# Patient Record
Sex: Male | Born: 1950 | Hispanic: No | Marital: Married | Smoking: Never smoker
Health system: Southern US, Community
[De-identification: ages and names within clinical notes are randomized; demographics above are authoritative.]

## PROBLEM LIST (undated history)

## (undated) DIAGNOSIS — E785 Hyperlipidemia, unspecified: Secondary | ICD-10-CM

## (undated) DIAGNOSIS — I252 Old myocardial infarction: Secondary | ICD-10-CM

## (undated) DIAGNOSIS — I10 Essential (primary) hypertension: Secondary | ICD-10-CM

## (undated) DIAGNOSIS — Z951 Presence of aortocoronary bypass graft: Secondary | ICD-10-CM

## (undated) DIAGNOSIS — N529 Male erectile dysfunction, unspecified: Secondary | ICD-10-CM

## (undated) DIAGNOSIS — I2510 Atherosclerotic heart disease of native coronary artery without angina pectoris: Secondary | ICD-10-CM

## (undated) DIAGNOSIS — Z955 Presence of coronary angioplasty implant and graft: Secondary | ICD-10-CM

## (undated) DIAGNOSIS — R9439 Abnormal result of other cardiovascular function study: Secondary | ICD-10-CM

## (undated) DIAGNOSIS — I25810 Atherosclerosis of coronary artery bypass graft(s) without angina pectoris: Secondary | ICD-10-CM

## (undated) DIAGNOSIS — Z95828 Presence of other vascular implants and grafts: Secondary | ICD-10-CM

## (undated) HISTORY — DX: Presence of coronary angioplasty implant and graft: Z95.5

## (undated) HISTORY — DX: Atherosclerotic heart disease of native coronary artery without angina pectoris: I25.10

## (undated) HISTORY — DX: Old myocardial infarction: I25.2

## (undated) HISTORY — DX: Male erectile dysfunction, unspecified: N52.9

## (undated) HISTORY — DX: Atherosclerosis of coronary artery bypass graft(s) without angina pectoris: I25.810

## (undated) HISTORY — DX: Presence of other vascular implants and grafts: Z95.828

## (undated) HISTORY — DX: Hyperlipidemia, unspecified: E78.5

## (undated) HISTORY — PX: CORONARY ANGIOPLASTY WITH STENT PLACEMENT: SHX49

## (undated) HISTORY — DX: Presence of aortocoronary bypass graft: Z95.1

## (undated) HISTORY — DX: Abnormal result of other cardiovascular function study: R94.39

---

## 2002-08-11 ENCOUNTER — Inpatient Hospital Stay (HOSPITAL_COMMUNITY): Admission: AD | Admit: 2002-08-11 | Discharge: 2002-08-13 | Payer: Self-pay | Admitting: Cardiology

## 2003-05-03 ENCOUNTER — Encounter: Admission: RE | Admit: 2003-05-03 | Discharge: 2003-05-03 | Payer: Self-pay | Admitting: Specialist

## 2004-06-26 DIAGNOSIS — I252 Old myocardial infarction: Secondary | ICD-10-CM

## 2004-06-26 DIAGNOSIS — Z951 Presence of aortocoronary bypass graft: Secondary | ICD-10-CM | POA: Insufficient documentation

## 2004-06-26 HISTORY — DX: Old myocardial infarction: I25.2

## 2004-06-26 HISTORY — PX: CORONARY ARTERY BYPASS GRAFT: SHX141

## 2004-06-26 HISTORY — DX: Presence of aortocoronary bypass graft: Z95.1

## 2004-07-08 ENCOUNTER — Encounter: Payer: Self-pay | Admitting: Emergency Medicine

## 2004-07-08 ENCOUNTER — Inpatient Hospital Stay (HOSPITAL_COMMUNITY): Admission: AD | Admit: 2004-07-08 | Discharge: 2004-07-18 | Payer: Self-pay | Admitting: Cardiovascular Disease

## 2004-08-18 ENCOUNTER — Encounter: Admission: RE | Admit: 2004-08-18 | Discharge: 2004-08-18 | Payer: Self-pay | Admitting: Cardiothoracic Surgery

## 2004-08-21 ENCOUNTER — Encounter (HOSPITAL_COMMUNITY): Admission: RE | Admit: 2004-08-21 | Discharge: 2004-11-19 | Payer: Self-pay | Admitting: Cardiovascular Disease

## 2004-10-23 ENCOUNTER — Ambulatory Visit (HOSPITAL_COMMUNITY): Admission: RE | Admit: 2004-10-23 | Discharge: 2004-10-25 | Payer: Self-pay | Admitting: Cardiovascular Disease

## 2004-11-17 ENCOUNTER — Encounter: Admission: RE | Admit: 2004-11-17 | Discharge: 2004-11-17 | Payer: Self-pay | Admitting: Cardiology

## 2005-03-07 ENCOUNTER — Emergency Department (HOSPITAL_COMMUNITY): Admission: EM | Admit: 2005-03-07 | Discharge: 2005-03-07 | Payer: Self-pay | Admitting: Emergency Medicine

## 2005-03-19 ENCOUNTER — Encounter: Admission: RE | Admit: 2005-03-19 | Discharge: 2005-03-19 | Payer: Self-pay | Admitting: General Practice

## 2005-03-29 ENCOUNTER — Encounter: Admission: RE | Admit: 2005-03-29 | Discharge: 2005-04-18 | Payer: Self-pay | Admitting: General Practice

## 2005-12-10 ENCOUNTER — Encounter: Admission: RE | Admit: 2005-12-10 | Discharge: 2005-12-10 | Payer: Self-pay | Admitting: Cardiology

## 2005-12-21 ENCOUNTER — Ambulatory Visit (HOSPITAL_COMMUNITY): Admission: RE | Admit: 2005-12-21 | Discharge: 2005-12-21 | Payer: Self-pay | Admitting: Cardiology
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
## 2007-06-21 IMAGING — CR DG CHEST 2V
2 series · 2 of 2 positions shown · non-contrast
Comparison: 03/07/05.

CLINICAL DATA: Coronary artery disease.  CABG.  
 CHEST ? 2 VIEW:

[w chest pa]
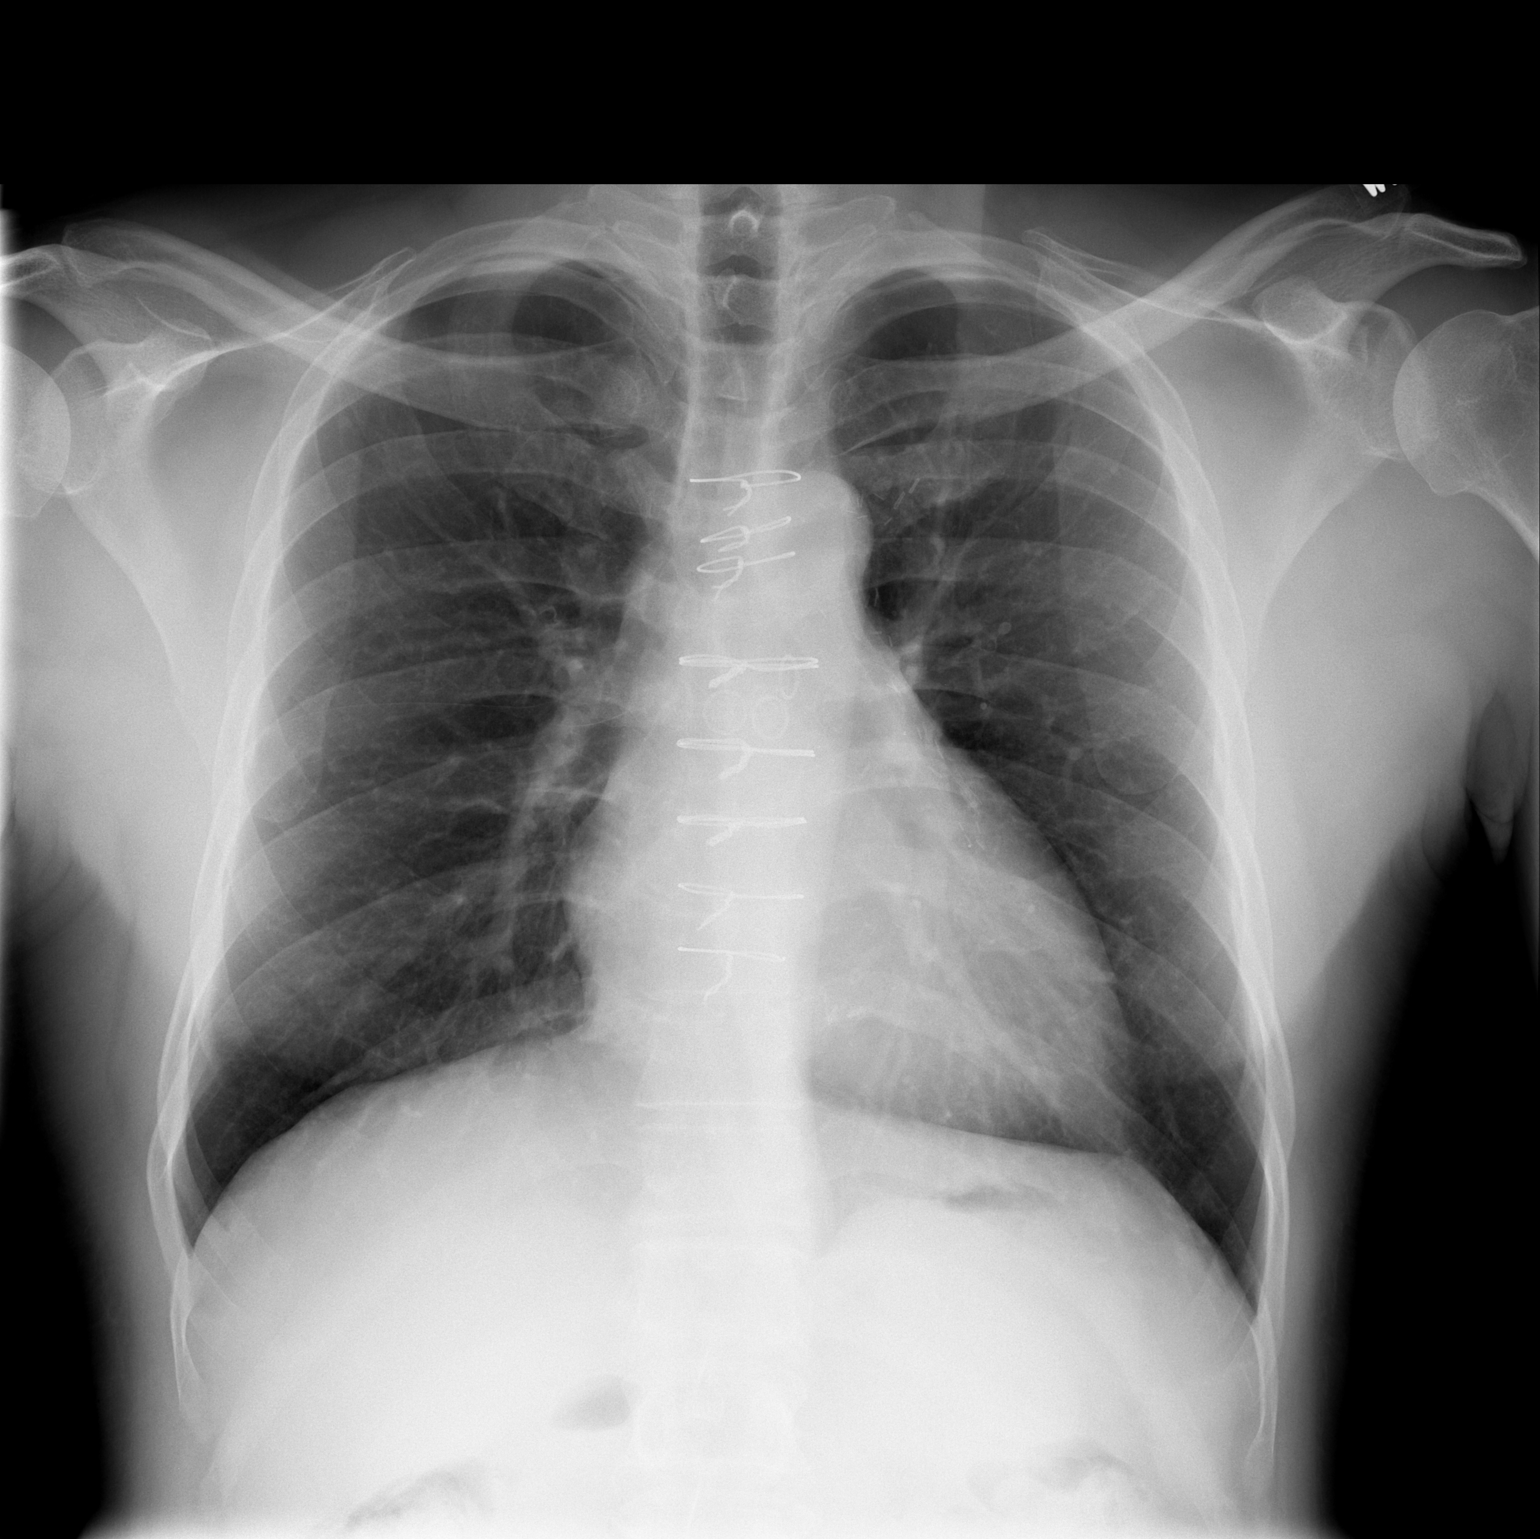

[w chest lat]
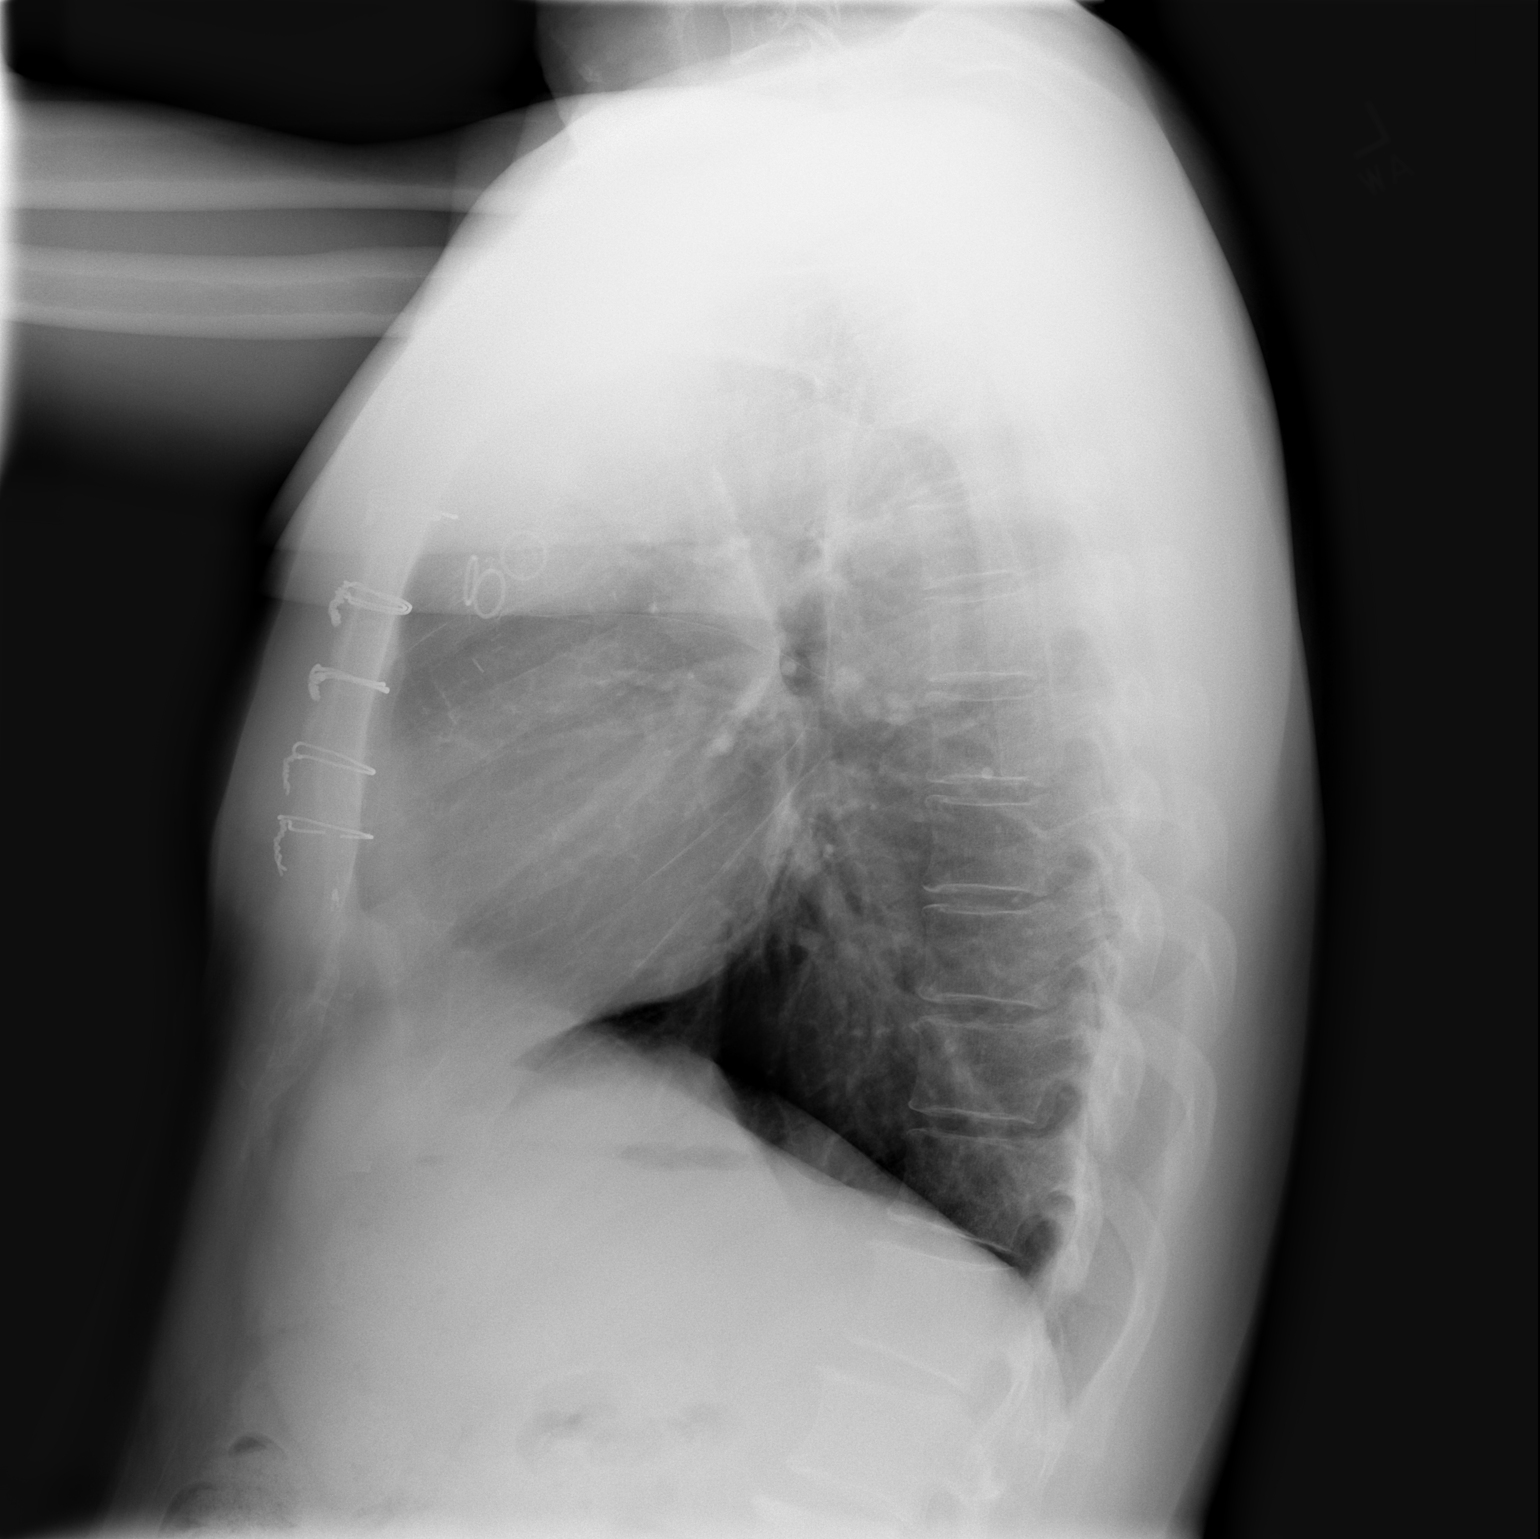

[2 of 2 positions shown; findings below may reference images not displayed]

FINDINGS: Patient is status-post median sternotomy and CABG procedure.  Heart size is normal.  There are no effusions or edema.  No airspace opacities are identified.
IMPRESSION: 1.  Stable mild cardiomegaly. 
 2.  No active cardiopulmonary disease.

## 2010-09-27 ENCOUNTER — Other Ambulatory Visit: Payer: Self-pay | Admitting: Cardiovascular Disease

## 2010-09-27 NOTE — Telephone Encounter (Signed)
This patient is not being followed by Dr. Mariah Milling.

## 2011-02-15 ENCOUNTER — Other Ambulatory Visit: Payer: Self-pay | Admitting: Cardiovascular Disease

## 2011-02-15 ENCOUNTER — Telehealth: Payer: Self-pay

## 2011-02-15 NOTE — Telephone Encounter (Signed)
Refill sent for metoprolol.  

## 2011-02-15 NOTE — Telephone Encounter (Signed)
Notified pharmacist patient has not been seen by Dr. Mariah Milling at [facility].  Told need to contact [facility] for refills on medications. [facility] pharmacist tech states will cancel the refill that was sent by Dr. Mariah Milling.

## 2011-07-31 ENCOUNTER — Encounter (HOSPITAL_COMMUNITY): Payer: Self-pay | Admitting: *Deleted

## 2011-07-31 ENCOUNTER — Emergency Department (HOSPITAL_COMMUNITY): Payer: Managed Care, Other (non HMO)

## 2011-07-31 ENCOUNTER — Emergency Department (HOSPITAL_COMMUNITY)
Admission: EM | Admit: 2011-07-31 | Discharge: 2011-07-31 | Disposition: A | Discharge status: Home / Self Care | Payer: Managed Care, Other (non HMO) | Attending: Emergency Medicine | Admitting: Emergency Medicine

## 2011-07-31 DIAGNOSIS — I10 Essential (primary) hypertension: Secondary | ICD-10-CM | POA: Insufficient documentation

## 2011-07-31 DIAGNOSIS — R0781 Pleurodynia: Secondary | ICD-10-CM

## 2011-07-31 DIAGNOSIS — I2510 Atherosclerotic heart disease of native coronary artery without angina pectoris: Secondary | ICD-10-CM | POA: Insufficient documentation

## 2011-07-31 DIAGNOSIS — R079 Chest pain, unspecified: Secondary | ICD-10-CM | POA: Insufficient documentation

## 2011-07-31 HISTORY — DX: Essential (primary) hypertension: I10

## 2011-07-31 NOTE — ED Notes (Signed)
Pt reports L sided rib pain x5 days. Denies injury. Sts he exercises, pain worse with certain movements and deep breaths.

## 2011-07-31 NOTE — ED Notes (Signed)
Patient discharge via ambulatory with a steady gait. Respirations equal and unlabored. Skin warm and dry. No acute distress noted. 

## 2011-07-31 NOTE — ED Provider Notes (Signed)
History     CSN: [id number]  Arrival date & time 07/31/11  1803   First MD Initiated Contact with Patient 07/31/11 2111      Chief Complaint  Patient presents with  . Rib Pain     (Consider location/radiation/quality/duration/timing/severity/associated sxs/prior treatment) HPI Comments: Patient reports that he has pain of his left lower rib for the past 2 weeks.  Pain has been constant.  He has been doing frequent exercises to try to help with the pain.  Pain has not changed over the course of 2 weeks.  He reports that he does daily exercises and lifts weights.  He has not taken anything for pain.  He denies any acute injury or trauma.  Denies SOB.  Denies cough, fever, or chills.  Denies nausea, vomiting, or diaphoresis.    The history is provided by the patient.    Past Medical History  Diagnosis Date  . Hypertension   . Coronary artery disease     Past Surgical History  Procedure Date  . Cardiac surgery     No family history on file.  History  Substance Use Topics  . Smoking status: Never Smoker   . Smokeless tobacco: Not on file  . Alcohol Use: No      Review of Systems  Constitutional: Negative for fever, chills and diaphoresis.  Respiratory: Negative for cough, chest tightness, shortness of breath and wheezing.   Cardiovascular: Negative for leg swelling.  Gastrointestinal: Negative for nausea and vomiting.  Musculoskeletal: Negative for back pain.  Skin: Negative for rash.  Neurological: Negative for dizziness, syncope, light-headedness and numbness.    Allergies  Review of patient's allergies indicates no known allergies.  Home Medications   Current Outpatient Rx  Name Route Sig Dispense Refill  . AMLODIPINE BESY-BENAZEPRIL HCL 10-40 MG PO CAPS Oral Take 1 capsule by mouth daily.    . ASPIRIN EC 81 MG PO TBEC Oral Take 81 mg by mouth daily.    [name] EZETIMIBE-SIMVASTATIN 10-40 MG PO TABS Oral Take 1 tablet by mouth daily.    [name] METOPROLOL SUCCINATE ER  50 MG PO TB24  TAKE ONE-HALF TABLET BY MOUTH EVERY DAY 90 tablet 3  . PANTOPRAZOLE SODIUM 40 MG PO TBEC Oral Take 40 mg by mouth daily.      BP 142/67  Pulse 64  Temp(Src) 98.5 F (36.9 C) (Oral)  Resp 16  SpO2 100%  Physical Exam  Nursing note and vitals reviewed. Constitutional: He appears well-developed and well-nourished. No distress.  HENT:  Head: Normocephalic and atraumatic.  Mouth/Throat: Oropharynx is clear and moist.  Neck: Normal range of motion. Neck supple.  Cardiovascular: Normal rate, regular rhythm and normal heart sounds.   Pulmonary/Chest: Effort normal and breath sounds normal. No accessory muscle usage. Not tachypneic. No respiratory distress. He has no decreased breath sounds. He has no wheezes. He has no rales. He exhibits tenderness.       Tenderness to palpation of the 8th left lateral rib.  Abdominal: Soft. There is no tenderness.  Musculoskeletal: Normal range of motion.  Neurological: He is alert.  Skin: Skin is warm and dry. No rash noted. He is not diaphoretic. No erythema.  Psychiatric: He has a normal mood and affect.    ED Course  Procedures (including critical care time)  Labs Reviewed - No data to display Dg Ribs Unilateral W/chest Left  07/31/2011  *RADIOLOGY REPORT*  Clinical Data: Chest and rib pain.  LEFT RIBS AND CHEST - 3+ VIEW  Comparison: 12/10/2005.  Findings: The cardiac silhouette, mediastinal and hilar contours are within normal limits and stable.  Stable surgical changes from triple bypass surgery.  The lungs are clear.  No pleural effusion. No pneumothorax.  Dedicated views of the left ribs demonstrate no definite acute rib fractures or destructive bony changes.  IMPRESSION:  1.  No acute cardiopulmonary findings. 2.  No definite acute left-sided rib fracture.  Original Report Authenticated By: P. Loralie Champagne, M.D.     No diagnosis found.    MDM  Patient presenting with pain over his left rib that has been present for 2  weeks.  Rib ttp.  Pain is constant.  No SOB, diaphoresis, nausea, or vomiting.  No known trauma or injury.  However, patient lifts weights and has been doing exercises daily over the past 2 weeks.  Therefore, feel that pain is musculoskeletal.  Xray negative.        Pascal Lux [place], PA-C 08/01/11 1559

## 2011-08-03 NOTE — ED Provider Notes (Signed)
Medical screening examination/treatment/procedure(s) were performed by non-physician practitioner and as supervising physician I was immediately available for consultation/collaboration.   Celene Kras, MD 08/03/11 [phone]

## 2012-08-18 ENCOUNTER — Telehealth: Payer: Self-pay | Admitting: Cardiology

## 2012-08-18 DIAGNOSIS — E785 Hyperlipidemia, unspecified: Secondary | ICD-10-CM

## 2012-08-18 DIAGNOSIS — I10 Essential (primary) hypertension: Secondary | ICD-10-CM

## 2012-08-18 DIAGNOSIS — I2510 Atherosclerotic heart disease of native coronary artery without angina pectoris: Secondary | ICD-10-CM

## 2012-08-18 DIAGNOSIS — Z951 Presence of aortocoronary bypass graft: Secondary | ICD-10-CM

## 2012-08-18 NOTE — Telephone Encounter (Signed)
Chart received and reviewed.  Will defer to Jasmine December, RN for Dr. Herbie Baltimore.  Chart# [id number] on Sharon's desk.

## 2012-08-18 NOTE — Telephone Encounter (Signed)
Paper chart requested.

## 2012-08-18 NOTE — Telephone Encounter (Signed)
Pt called stating that he needs a stress test? I need an order if he needs one done. Thank you

## 2012-08-19 ENCOUNTER — Telehealth (HOSPITAL_COMMUNITY): Payer: Self-pay | Admitting: Cardiology

## 2012-08-19 NOTE — Telephone Encounter (Signed)
Returned call and informed pt Kevin Yu, Dr. Elissa Hefty nurse is aware of his request and she will contact him once it has been discussed w/ Dr. Herbie Baltimore.

## 2012-08-19 NOTE — Telephone Encounter (Signed)
Pt is calling about his stress test. Does he need to have one done?

## 2012-08-19 NOTE — Telephone Encounter (Signed)
My last clinic note from Nov 2013 ndicated that I had intended for him to have a TM Cardiolite ST prior to ~6 months f/u.  Is he having symptoms, or did he simply remember this plan?  Somehow, his Cardiolite was not scheduled - it should be, prior to f/u appt.  Also, I received a notification from [facility] that he has not filled his Metoprolol 50 mg bid Rx.  Why don't we try to get the ST ordered & his f/u scheduled -- we can address the BB @ that time.  Marykay Lex, MD

## 2012-08-19 NOTE — Telephone Encounter (Signed)
See previous telephone note. 

## 2012-08-19 NOTE — Telephone Encounter (Signed)
Sure - work him in with either me or a NP/PA.  Marykay Lex, MD

## 2012-08-20 NOTE — Telephone Encounter (Signed)
Message forwarded to S. Martin, RN.  

## 2012-08-21 NOTE — Telephone Encounter (Signed)
Spoke with patient. Will have a scheduler call him to schedule exercise stress and a follow up appointment to Dr Herbie Baltimore.

## 2012-08-22 NOTE — Telephone Encounter (Signed)
thnx

## 2012-08-25 ENCOUNTER — Telehealth (HOSPITAL_COMMUNITY): Payer: Self-pay | Admitting: Cardiology

## 2012-08-25 NOTE — Telephone Encounter (Signed)
LEFT MESSAGE FOR PATIENT TO CALL AND SCHEDULE TESTING ORDERED BY [facility]

## 2012-08-26 NOTE — Telephone Encounter (Signed)
Tried to get in touch with pt to get his stress test scheduled. There was no answer on 7/1 and other number was d/c. Mailed a letter to call back to schedule this appointment.

## 2012-08-27 ENCOUNTER — Telehealth: Payer: Self-pay | Admitting: Cardiology

## 2012-08-27 NOTE — Telephone Encounter (Signed)
LEFT MESSAGE FOR PATIENT TO CALL BACK AND SCHEDULE STRESS TEST

## 2012-08-27 NOTE — Telephone Encounter (Signed)
Left message to call back  

## 2012-10-01 ENCOUNTER — Ambulatory Visit (HOSPITAL_COMMUNITY)
Admission: RE | Admit: 2012-10-01 | Discharge: 2012-10-01 | Disposition: A | Discharge status: Home / Self Care | Payer: Managed Care, Other (non HMO) | Source: Ambulatory Visit | Attending: Cardiology | Admitting: Cardiology

## 2012-10-01 ENCOUNTER — Other Ambulatory Visit (HOSPITAL_COMMUNITY): Payer: Self-pay | Admitting: Cardiology

## 2012-10-01 DIAGNOSIS — I10 Essential (primary) hypertension: Secondary | ICD-10-CM

## 2012-10-01 DIAGNOSIS — I2510 Atherosclerotic heart disease of native coronary artery without angina pectoris: Secondary | ICD-10-CM

## 2012-10-01 DIAGNOSIS — Z951 Presence of aortocoronary bypass graft: Secondary | ICD-10-CM

## 2012-10-01 DIAGNOSIS — E785 Hyperlipidemia, unspecified: Secondary | ICD-10-CM

## 2012-10-03 ENCOUNTER — Other Ambulatory Visit (HOSPITAL_COMMUNITY): Payer: Self-pay | Admitting: Cardiology

## 2012-10-03 ENCOUNTER — Ambulatory Visit (HOSPITAL_COMMUNITY)
Admission: RE | Admit: 2012-10-03 | Discharge: 2012-10-03 | Disposition: A | Discharge status: Home / Self Care | Payer: Managed Care, Other (non HMO) | Source: Ambulatory Visit | Attending: Internal Medicine | Admitting: Internal Medicine

## 2012-10-03 ENCOUNTER — Ambulatory Visit (HOSPITAL_COMMUNITY)
Admission: RE | Admit: 2012-10-03 | Discharge: 2012-10-03 | Disposition: A | Discharge status: Home / Self Care | Payer: Managed Care, Other (non HMO) | Source: Ambulatory Visit | Attending: Cardiovascular Disease | Admitting: Cardiovascular Disease

## 2012-10-03 DIAGNOSIS — I2510 Atherosclerotic heart disease of native coronary artery without angina pectoris: Secondary | ICD-10-CM

## 2012-10-03 DIAGNOSIS — Z951 Presence of aortocoronary bypass graft: Secondary | ICD-10-CM

## 2012-10-03 DIAGNOSIS — I25810 Atherosclerosis of coronary artery bypass graft(s) without angina pectoris: Secondary | ICD-10-CM | POA: Insufficient documentation

## 2012-10-03 DIAGNOSIS — I10 Essential (primary) hypertension: Secondary | ICD-10-CM | POA: Insufficient documentation

## 2012-10-03 DIAGNOSIS — R9439 Abnormal result of other cardiovascular function study: Secondary | ICD-10-CM

## 2012-10-03 DIAGNOSIS — E785 Hyperlipidemia, unspecified: Secondary | ICD-10-CM

## 2012-10-03 DIAGNOSIS — I252 Old myocardial infarction: Secondary | ICD-10-CM | POA: Insufficient documentation

## 2012-10-03 HISTORY — DX: Abnormal result of other cardiovascular function study: R94.39

## 2012-10-03 MED ORDER — TECHNETIUM TC 99M SESTAMIBI GENERIC - CARDIOLITE
11.0000 | Freq: Once | INTRAVENOUS | Status: AC | PRN
  Administered 2012-10-03: 11 via INTRAVENOUS

## 2012-10-03 MED ORDER — TECHNETIUM TC 99M SESTAMIBI GENERIC - CARDIOLITE
31.9000 | Freq: Once | INTRAVENOUS | Status: AC | PRN
  Administered 2012-10-03: 31.9 via INTRAVENOUS

## 2012-10-03 NOTE — Procedures (Addendum)
[place]  CARDIOVASCULAR IMAGING [address] [place] [address] [phone]  Cardiology Nuclear Med Study  Kevin [name] is [name] 62 y.o. male     MRN : [id number]     DOB: [birth year]  Procedure Date: 10/03/2012  Nuclear Med Background Indication for Stress Test:  Graft Patency History:  CAD;MI;CABG X4 Cardiac Risk Factors: Hypertension and Lipids  Symptoms:  DENIES SYMPTOMS   Nuclear Pre-Procedure Caffeine/Decaff Intake:  7:00pm NPO After: 5:00am   IV Site: R Forearm  IV 0.9% NS with Angio Cath:  22g  Chest Size (in):  42"  IV Started by: Emmit Pomfret, RN  Height:    Cup Size: n/[name]  BMI:  There is no height or weight on file to calculate BMI. Weight:      Tech Comments:  N/[name]    Nuclear Med Study 1 or 2 day study: 1 day  Stress Test Type:  Stress  Order Authorizing Provider:  DAVID HARDING,MD   Resting Radionuclide: Technetium 54m Sestamibi  Resting Radionuclide Dose: 11.0 mCi   Stress Radionuclide:  Technetium 46m Sestamibi  Stress Radionuclide Dose: 31.9 mCi           Stress Protocol Rest HR: 64 Stress HR: 160  Rest BP: 150/90 Stress BP: 201/92  Exercise Time (min): 8:30 METS: 10.1   Predicted Max HR: 157 bpm % Max HR: 101.91 bpm Rate Pressure Product: 46962  Dose of Adenosine (mg):  n/[name] Dose of Lexiscan: n/[name] mg  Dose of Atropine (mg): n/[name] Dose of Dobutamine: n/[name] mcg/kg/min (at max HR)  Stress Test Technologist: Esperanza Sheets, CCT Nuclear Technologist: Gonzella Lex, CNMT   Rest Procedure:  [name] The patient stopped due to achieving target heart rate and mild SOB and denied any chest pain.  There were significant ST-T wave changes.  Technetium 71m Sestamibi was injected at peak exercise and myocardial  perfusion imaging was performed after [name] brief delay.  Transient Ischemic Dilatation (Normal <1.22):  0.99 Lung/Heart Ratio (Normal <0.45):  0.26 QGS EDV:  134 ml QGS ESV:  57 ml LV Ejection Fraction: 57%  Signed by      Rest ECG: NSR with non-specific ST-T wave changes  Stress ECG: 2 - 3 mm inferolateral ST segmnt depression  QPS Raw Data Images:  Normal; no motion artifact; normal heart/lung ratio. Stress Images:  Normal homogeneous uptake in all areas of the myocardium. Rest Images:  Mild diaphragmatic attenuation with otherwise normal homogeneous uptake. Subtraction (SDS):  No evidence of statistically significant ischemia.  Impression Exercise Capacity:  Good exercise capacity. BP Response:  Hypertensive blood pressure response. Clinical Symptoms:  No chest pain; mils shortness of breath ECG Impression:   2 -3 mm ST abnormalities suggestive of ischemia. Comparison with Prior Nuclear Study: No significant change from previous study  Overall Impression:  Low risk stress nuclear study demonstrating 2 - 3 mm ST segment depression with stress and mild diaphragmatic attenuation without scintigraphic evidence for statistically significant ischemia.  LV Wall Motion:  NL LV Function, EF 57%; NL Wall Motion   Kevin Hilburn A, MD  10/03/2012 1:08 PM

## 2012-10-03 NOTE — Procedures (Signed)
[place] CARDIOVASCULAR IMAG[facility] [address] [place] [address] [phone]  Cardiology Nuclear Med Study  Kevin [name] is a 62 y.o. male     MRN : [id number]     DOB: 1950-09-16  Procedure Date: 10/03/2012  Nuclear Med Background Indication for Stress Test:  Graft Patency History:  CAD;MI;CABG X4 Cardiac Risk Factors: Hypertension and Lipids  Symptoms:  PT DENIES SYMPTOMS   Nuclear Pre-Procedure Caffeine/Decaff Intake:  7:00pm NPO After: 5:00am   IV Site: R Forearm  IV 0.9% NS with Angio Cath:  22g  Chest Size (in):  42"  IV Started by: Emmit Pomfret, RN  Height: 5\' 11"  (1.803 m)  Cup Size: n/a  BMI:  Body mass index is 29.3 kg/(m^2). Weight:  210 lb (95.255 kg)   Tech Comments:  N/A    Nuclear Med Study 1 or 2 day study: 1 day  Stress Test Type:  Stress  Order Authorizing Provider:  Bryan Lemma, MD   Resting Radionuclide: Technetium 52m Sestamibi  Resting Radionuclide Dose: 11.0 mCi   Stress Radionuclide:  Technetium 9m Sestamibi  Stress Radionuclide Dose: 31.9 mCi           Stress Protocol Rest HR: 64 Stress HR: 160  Rest BP: 150/90 Stress BP: 201/92  Exercise Time (min): 8:30 METS: 10.1   Predicted Max HR: 157 bpm % Max HR: 101.91 bpm Rate Pressure Product: 21308  Dose of Adenosine (mg):  n/a Dose of Lexiscan: n/a mg  Dose of Atropine (mg): n/a Dose of Dobutamine: n/a mcg/kg/min (at max HR)  Stress Test Technologist: Esperanza Sheets, CCT Nuclear Technologist: Gonzella Lex, CNMT   Rest Procedure:  Myocardial perfusion imaging was performed at rest 45 minutes following the intravenous administration of Technetium 49m Sestamibi. Stress Procedure:  The patient performed treadmill exercise using a Bruce  Protocol for 8:30 minutes. The patient stopped due to target heart rate achieved with mild SOB and denied any chest pain.  There were significant ST-T wave changes.  Technetium 15m Sestamibi was injected at peak exercise  and myocardial perfusion imaging was performed after a brief delay.  Transient Ischemic Dilatation (Normal <1.22):  0.77 Lung/Heart Ratio (Normal <0.45):  0.34 QGS EDV:  93 ml QGS ESV:  36 ml LV Ejection Fraction: 61%  Signed by

## 2012-10-07 ENCOUNTER — Encounter: Payer: Self-pay | Admitting: Cardiology

## 2012-10-16 ENCOUNTER — Telehealth: Payer: Self-pay | Admitting: *Deleted

## 2012-10-16 NOTE — Telephone Encounter (Signed)
Message copied by Tobin Chad on Thu Oct 16, 2012  4:42 PM ------      Message from: [facility], DAVID      Created: Sun Oct 12, 2012  2:19 PM       I will need to look at the images from his last ST to compare.       No CP with ECG changes not corresponding to Imaging ischemia - probably due to known occluded SVG.      Will just have him come in sooner than expected to discuss results.  Nuc Images over-rule ECG.            Marykay Lex, MD             ------

## 2012-10-16 NOTE — Telephone Encounter (Signed)
Left message for patient to call back  To make an appointment  To go over his myoview

## 2012-10-17 ENCOUNTER — Telehealth: Payer: Self-pay | Admitting: Cardiology

## 2012-10-17 NOTE — Telephone Encounter (Signed)
Called pt to schedule a fu appointment

## 2012-10-17 NOTE — Telephone Encounter (Signed)
Message copied by Almira Coaster on Fri Oct 17, 2012  8:53 AM ------      Message from: Tobin Chad      Created: Thu Oct 16, 2012  4:49 PM       Please call pt  about appointment to discuss recent myoview  Sometime in Sept.  He has a recall for Oct.that can be cancel if comes in Sept. ------

## 2012-10-21 ENCOUNTER — Ambulatory Visit (INDEPENDENT_AMBULATORY_CARE_PROVIDER_SITE_OTHER): Payer: Managed Care, Other (non HMO) | Admitting: Cardiology

## 2012-10-21 ENCOUNTER — Encounter: Payer: Self-pay | Admitting: Cardiology

## 2012-10-21 VITALS — BP 146/72 | HR 68 | Ht 71.0 in | Wt 206.7 lb

## 2012-10-21 DIAGNOSIS — I25810 Atherosclerosis of coronary artery bypass graft(s) without angina pectoris: Secondary | ICD-10-CM

## 2012-10-21 DIAGNOSIS — E785 Hyperlipidemia, unspecified: Secondary | ICD-10-CM

## 2012-10-21 DIAGNOSIS — I10 Essential (primary) hypertension: Secondary | ICD-10-CM

## 2012-10-21 DIAGNOSIS — I2510 Atherosclerotic heart disease of native coronary artery without angina pectoris: Secondary | ICD-10-CM

## 2012-10-21 DIAGNOSIS — Z951 Presence of aortocoronary bypass graft: Secondary | ICD-10-CM

## 2012-10-21 MED ORDER — CHLORTHALIDONE 25 MG PO TABS: 25.0000 mg | ORAL_TABLET | Freq: Every day | ORAL | Status: DC

## 2012-10-21 MED ORDER — ATORVASTATIN CALCIUM 20 MG PO TABS: 20.0000 mg | ORAL_TABLET | Freq: Every day | ORAL | Status: DC
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
## 2012-10-21 NOTE — Patient Instructions (Addendum)
Your stress test Images looked fine, but the ECG was abnormal.  Probably this is due to the known blocked artery.  The fact that you did not have symptoms on the stress test is reassuring that the images are correct.  Your blood pressure is a bit elevated today -- I would like to start you on a new medicine called Chlorthalidone 25 mg daily.  You are due for having your cholesterol levels checked -- we will probably need to restart a medicine similar to Vytorin that does not interact with your other medicines. - atorvastatin (Lipitor) 20 mg.  Please send [place] a copy of your blood work checked at work  I think you are pretty healthy, but I would like you to return for a routine check in ~6 months with one of my PAs to reassess your blood pressure &cholesterol.  Then I will see you 6 months from then(12 month from this month) . Very sorry for loss of your to brothers.   Marykay Lex, MD

## 2012-11-02 ENCOUNTER — Encounter: Payer: Self-pay | Admitting: Cardiology

## 2012-11-02 DIAGNOSIS — E785 Hyperlipidemia, unspecified: Secondary | ICD-10-CM | POA: Insufficient documentation

## 2012-11-02 DIAGNOSIS — I25810 Atherosclerosis of coronary artery bypass graft(s) without angina pectoris: Secondary | ICD-10-CM | POA: Insufficient documentation

## 2012-11-02 DIAGNOSIS — I10 Essential (primary) hypertension: Secondary | ICD-10-CM | POA: Insufficient documentation

## 2012-11-03 NOTE — Assessment & Plan Note (Signed)
I have asked that he gets his blood work checked at work. They do that annually in the fall, which should be at the end of the month. I have just asked that he send [place] the results.  Plan: Check labs start Lipitor 20 mg daily. My suspicion was that we were trying to stop Vytorin because it simvastatin in conjunction with amlodipine. And then he never got the prescription for the medication.

## 2012-11-03 NOTE — Assessment & Plan Note (Addendum)
Blood pressure is not at goal today. His heart rate at 68, don't have much room to increase the metoprolol. I had not realized that he was on HCTZ so I given a prescription for chlorthalidone. I will probably just have him stop the HCTZ and use chlorthalidone instead as a more potent medication. If chlorthalidone is ineffective, and explained would be to switch him from metoprolol to carvedilol for better blood pressure versus rate control.

## 2012-11-03 NOTE — Assessment & Plan Note (Signed)
He is actually doing relatively well with no active symptoms. 3 of 4 graft patency on the known occluded graft to the ramus. I think the ECG changes are probably consistent with prior infarct in that area. This is a began to show up on the stress test as an infarct. He did not have any symptoms reaching 10 metabolic equivalents and a half minutes which is doing quite well. I think his lack of symptoms and elected scintigraphic evidence of ischemia would suggest that the stress test is low risk with no signs of ischemia and therefore no further action needs to be taken.  He is currently on a beta blocker, ACE inhibitor as part of Lotrel and the calcium channel blocker portion of the Lotrel. He takes aspirin but not Plavix. He's only taken Lovaza but not a statin.  We will start Lipitor today.

## 2013-01-21 ENCOUNTER — Ambulatory Visit: Payer: Managed Care, Other (non HMO)

## 2013-01-21 ENCOUNTER — Ambulatory Visit: Payer: Managed Care, Other (non HMO) | Admitting: Family Medicine

## 2013-01-21 VITALS — BP 148/68 | HR 78 | Temp 99.3°F | Resp 18 | Ht 70.0 in | Wt 210.0 lb

## 2013-01-21 DIAGNOSIS — M546 Pain in thoracic spine: Secondary | ICD-10-CM

## 2013-01-21 DIAGNOSIS — M542 Cervicalgia: Secondary | ICD-10-CM

## 2013-01-21 MED ORDER — DICLOFENAC SODIUM 75 MG PO TBEC: 75.0000 mg | DELAYED_RELEASE_TABLET | Freq: Two times a day (BID) | ORAL | Status: DC

## 2013-01-21 MED ORDER — CYCLOBENZAPRINE HCL 5 MG PO TABS: 5.0000 mg | ORAL_TABLET | Freq: Every evening | ORAL | Status: DC | PRN
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
## 2013-01-23 ENCOUNTER — Other Ambulatory Visit: Payer: Self-pay | Admitting: Cardiology

## 2013-01-26 NOTE — Telephone Encounter (Signed)
Rx was sent to pharmacy electronically. 

## 2013-02-06 ENCOUNTER — Other Ambulatory Visit: Payer: Self-pay | Admitting: Cardiology

## 2013-02-06 NOTE — Telephone Encounter (Signed)
Rx was sent to pharmacy electronically. 

## 2013-02-22 ENCOUNTER — Other Ambulatory Visit: Payer: Self-pay | Admitting: Cardiology

## 2013-02-23 NOTE — Telephone Encounter (Signed)
Rx was sent to pharmacy electronically. 

## 2013-04-27 ENCOUNTER — Ambulatory Visit (INDEPENDENT_AMBULATORY_CARE_PROVIDER_SITE_OTHER): Payer: BC Managed Care – PPO | Admitting: Physician Assistant

## 2013-04-27 ENCOUNTER — Encounter: Payer: Self-pay | Admitting: Physician Assistant

## 2013-04-27 ENCOUNTER — Ambulatory Visit (HOSPITAL_COMMUNITY)
Admission: RE | Admit: 2013-04-27 | Discharge: 2013-04-27 | Disposition: A | Discharge status: Home / Self Care | Payer: BC Managed Care – PPO | Source: Ambulatory Visit | Attending: Internal Medicine | Admitting: Internal Medicine

## 2013-04-27 VITALS — BP 142/86 | HR 67 | Ht 71.0 in | Wt 206.0 lb

## 2013-04-27 DIAGNOSIS — R600 Localized edema: Secondary | ICD-10-CM

## 2013-04-27 DIAGNOSIS — R609 Edema, unspecified: Secondary | ICD-10-CM

## 2013-04-27 DIAGNOSIS — M79609 Pain in unspecified limb: Secondary | ICD-10-CM

## 2013-04-27 DIAGNOSIS — I2510 Atherosclerotic heart disease of native coronary artery without angina pectoris: Secondary | ICD-10-CM

## 2013-04-27 DIAGNOSIS — I10 Essential (primary) hypertension: Secondary | ICD-10-CM

## 2013-04-27 DIAGNOSIS — R079 Chest pain, unspecified: Secondary | ICD-10-CM | POA: Insufficient documentation

## 2013-04-27 NOTE — Assessment & Plan Note (Addendum)
Patient's chest pain appears noncardiac. It is exacerbated with palpation just left of the sternum.  Recommended some ibuprofen for pain continues

## 2013-04-27 NOTE — Assessment & Plan Note (Signed)
Blood pressure is mildly elevated this morning the patient does appear to be in some pain to his right lower extremity which may be contributing to this.

## 2013-04-27 NOTE — Progress Notes (Signed)
Date:  04/27/2013   ID:  Kevin Yu, DOB 11/26/1950, MRN [id number]  PCP:  Erlinda HongKPEGLO,MAURICE, MD  Primary Cardiologist: Herbie BaltimoreHarding    History of Present Illness: Kevin Yu is a 63 y.o. male with a PMH below.  He has a long-standing coronary artery history as delineated below, so Dr. Herbie BaltimoreHarding ordered a routine, surveillance Myoview stress test. This was read as being electrically positive for ischemia, but not positive for many scintigraphic standpoint.  He exercised for 8-1/2 minutes reaching 10.1 METS; he did not have any symptoms of chest pain, only mild shortness of breath.  There were 2-3 mm ST segment abnormalities that would suggest inferolateral ischemia. Scintigraphic imaging revealed mild diaphragmatic attenuation (which could also be consistent with a prior inferior lateral infarction) but no evidence of ischemia.  The patient presents today with right lower extremity pain and swelling.  The patient says the pain tends to ease off a little bit with ambulation.  He reports a large area of swelling or behind his right knee. This has been present for approximately one week. This reports some chest pain under his left breast area which occurred on one occasion it is exacerbated with palpation.  The patient currently denies nausea, vomiting, fever, shortness of breath, orthopnea, dizziness, PND, cough, congestion, abdominal pain, hematochezia, melena, claudication.  Wt Readings from Last 3 Encounters:  04/27/13 206 lb (93.441 kg)  01/21/13 210 lb (95.255 kg)  10/21/12 206 lb 11.2 oz (93.759 kg)     Past Medical History  Diagnosis Date  . H/O non-ST elevation myocardial infarction (NSTEMI) 06/2004    CATH - Multivessel CAD -->CABG X 4  . 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  . Presence of bare metal stent in left circumflex coronary artery     PCI RI: 2.330mm x 12 mm Mini Vision BMS  . Presence of bypass graft stent     PCI SVG-OM: 3.0 x 18 Cypher DES  . CAD  in native artery     Cath October 2007: RI-100% occluded, LAD-severe proximal 88 5%, mid 80% beyond SP1/D1 competitive flow.  Circumflex-OM1- 100 occluded.  RCA was occluded SVG-RI 100% occluded SVG-distal RCA, SVG-OM1 widely patent.  LIMA LAD patent.  [name]. CAD (coronary artery disease) of bypass graft     S/P  PCI of both SVG-OM and SVG-RI  . Hypertension   . Dyslipidemia, goal LDL below 70      formerly on Vytorin  . Erectile dysfunction     He uses Viagra  . Abnormal Nuclear Stress Test -- Myoview 10/03/2012    Exercise 8:30 min; 10.1 METS, no chest pain; 1-2 mm ST segment depression suggesting ischemia; no scintigraphic evidence of ischemia or infarction. --> Negative/low risk stress test    Current Outpatient Prescriptions  Medication Sig Dispense Refill  . amLODipine-benazepril (LOTREL) 10-40 MG per capsule TAKE ONE CAPSULE BY MOUTH EVERY DAY  30 capsule  8  . aspirin EC 81 MG tablet Take 81 mg by mouth daily.      [name]. atorvastatin (LIPITOR) 20 MG tablet Take 1 tablet (20 mg total) by mouth daily.  90 tablet  3  . chlorthalidone (HYGROTON) 25 MG tablet Take 1 tablet (25 mg total) by mouth daily.  90 tablet  3  . hydrochlorothiazide (HYDRODIURIL) 25 MG tablet TAKE ONE TABLET BY MOUTH EVERY DAY  30 tablet  5  . metoprolol succinate (TOPROL-XL) 50 MG 24 hr tablet Take 1/2 tablet my  mouth daily.  30 tablet  4  . omega-3 acid ethyl esters (LOVAZA) 1 G capsule Take 1 capsule by mouth daily.      [name] VIAGRA 100 MG tablet Take 1 tablet by mouth as needed.       No current facility-administered medications for this visit.    Allergies:   No Known Allergies  Social History:  The patient  reports that he has never smoked. He does not have any smokeless tobacco history on file. He reports that he does not drink alcohol or use illicit drugs.   Family history:  History reviewed. No pertinent family history.  ROS:  Please see the history of present illness.  All other systems reviewed and negative.     PHYSICAL EXAM: VS:  BP 142/86  Pulse 67  Ht 5\' 11"  (1.803 m)  Wt 206 lb (93.441 kg)  BMI 28.74 kg/m2 Well nourished, well developed, in no acute distress HEENT: Pupils are equal round react to light accommodation extraocular movements are intact.  Neck: no JVDNo cervical lymphadenopathy. Cardiac: Regular rate and rhythm without murmurs rubs or gallops. Lungs:  clear to auscultation bilaterally, no wheezing, rhonchi or rales Abd: soft, nontender, positive bowel sounds all quadrants, no hepatosplenomegaly Ext: 1+ right lower extremity edema. Patient has a soft area of swelling about the size of an egg behind his right knee.   2+ radial and dorsalis pedis pulses. Skin: warm and dry Neuro:  Grossly normal  EKG:  Normal sinus rhythm 76 beats per minute left atrial enlargement   ASSESSMENT AND PLAN:  Problem List Items Addressed This Visit   None

## 2013-04-27 NOTE — Patient Instructions (Signed)
1.  We will schedule you for lower extremity ultra sound of your right leg

## 2013-04-27 NOTE — Progress Notes (Signed)
Right Lower Ext. Venous Completed. Negative for DVT. Cissy Galbreath, BS, RDMS, RVT 

## 2013-04-30 NOTE — Progress Notes (Signed)
VM full

## 2013-05-18 ENCOUNTER — Encounter: Payer: Self-pay | Admitting: *Deleted

## 2013-05-21 ENCOUNTER — Ambulatory Visit: Payer: Managed Care, Other (non HMO) | Admitting: Cardiology

## 2013-07-27 ENCOUNTER — Other Ambulatory Visit: Payer: Self-pay | Admitting: Cardiology

## 2013-07-27 NOTE — Telephone Encounter (Signed)
Rx refill sent to patient pharmacy   

## 2013-11-18 ENCOUNTER — Other Ambulatory Visit: Payer: Self-pay | Admitting: Cardiology

## 2013-11-18 NOTE — Telephone Encounter (Signed)
Rx was sent to pharmacy electronically. 

## 2013-12-15 ENCOUNTER — Other Ambulatory Visit: Payer: Self-pay | Admitting: Cardiology

## 2013-12-15 NOTE — Telephone Encounter (Signed)
Rx was sent to pharmacy electronically. 

## 2014-01-19 ENCOUNTER — Other Ambulatory Visit: Payer: Self-pay | Admitting: Cardiology

## 2014-01-19 NOTE — Telephone Encounter (Signed)
Rx was sent to pharmacy electronically. 

## 2014-02-15 ENCOUNTER — Other Ambulatory Visit: Payer: Self-pay | Admitting: Cardiology

## 2014-02-15 NOTE — Telephone Encounter (Signed)
Rx refill sent to patient pharmacy   

## 2014-02-16 ENCOUNTER — Other Ambulatory Visit: Payer: Self-pay | Admitting: Cardiology

## 2014-02-16 NOTE — Telephone Encounter (Signed)
Rx refill sent to patient pharmacy   

## 2014-02-22 ENCOUNTER — Ambulatory Visit (INDEPENDENT_AMBULATORY_CARE_PROVIDER_SITE_OTHER): Payer: BC Managed Care – PPO | Admitting: Family Medicine

## 2014-02-22 VITALS — BP 170/66 | HR 67 | Temp 97.9°F | Resp 18 | Wt 195.0 lb

## 2014-02-22 DIAGNOSIS — A084 Viral intestinal infection, unspecified: Secondary | ICD-10-CM

## 2014-02-22 DIAGNOSIS — R197 Diarrhea, unspecified: Secondary | ICD-10-CM

## 2014-02-22 DIAGNOSIS — R1084 Generalized abdominal pain: Secondary | ICD-10-CM

## 2014-02-22 LAB — POCT CBC
Granulocyte percent: 64.9 %G (ref 37–80)
HEMATOCRIT: 41.8 % — AB (ref 43.5–53.7)
Hemoglobin: 13.5 g/dL — AB (ref 14.1–18.1)
Lymph, poc: 1.8 (ref 0.6–3.4)
MCH, POC: 26.9 pg — AB (ref 27–31.2)
MCHC: 32.3 g/dL (ref 31.8–35.4)
MCV: 83.3 fL (ref 80–97)
MID (CBC): 0.3 (ref 0–0.9)
MPV: 8.6 fL (ref 0–99.8)
POC Granulocyte: 4 (ref 2–6.9)
POC LYMPH %: 30 % (ref 10–50)
POC MID %: 5.1 %M (ref 0–12)
Platelet Count, POC: 128 10*3/uL — AB (ref 142–424)
RBC: 5.02 M/uL (ref 4.69–6.13)
RDW, POC: 15.1 %
WBC: 6.1 10*3/uL (ref 4.6–10.2)

## 2014-02-22 MED ORDER — DICYCLOMINE HCL 10 MG PO CAPS: 10.0000 mg | ORAL_CAPSULE | Freq: Three times a day (TID) | ORAL | Status: DC

## 2014-02-22 NOTE — Progress Notes (Signed)
Subjective: Since Saturday the patient has been having diarrhea. He has run frequently to the bathroom. Whenever he eats he has to go. The pharmacist gave him some Imodium today, and it does not seem to have helped her much. He complains of a lot of audible borborygmi. Has some bloating discomfort. Does not have a history of a lot of diarrhea. He said it started with a medially 8 on Saturday. He did go to work today.  Objective: No acute distress. No CVA tenderness. Chest clear. Heart regular without murmurs. Abdomen has bowel sounds present. Soft without masses. Mild nonspecific generalized tenderness.  Results for orders placed or performed in visit on 02/22/14  POCT CBC  Result Value Ref Range   WBC 6.1 4.6 - 10.2 K/uL   Lymph, poc 1.8 0.6 - 3.4   POC LYMPH PERCENT 30.0 10 - 50 %L   MID (cbc) 0.3 0 - 0.9   POC MID % 5.1 0 - 12 %M   POC Granulocyte 4.0 2 - 6.9   Granulocyte percent 64.9 37 - 80 %G   RBC 5.02 4.69 - 6.13 M/uL   Hemoglobin 13.5 (A) 14.1 - 18.1 g/dL   HCT, POC 19.141.8 (A) 47.843.5 - 53.7 %   MCV 83.3 80 - 97 fL   MCH, POC 26.9 (A) 27 - 31.2 pg   MCHC 32.3 31.8 - 35.4 g/dL   RDW, POC 29.515.1 %   Platelet Count, POC 128 (A) 142 - 424 K/uL   MPV 8.6 0 - 99.8 fL   Assessment: Probable viral gastroenteritis Diarrhea Mild abdominal pains  Plan: Try some Bentyl Return if worse

## 2014-02-22 NOTE — Patient Instructions (Signed)
Take dicyclomine one pill 4 times daily only if needed for the diarrhea.  Sometimes this medication can cause older men to have difficulty urinating. If that should happen discontinue the medication.  Drink plenty of fluids. Avoid rich and spicy foods. Avoid dairy products and alcohol.  If not much better by Wednesday please return. If worse at any time please return.

## 2014-03-03 ENCOUNTER — Telehealth: Payer: Self-pay | Admitting: Cardiology

## 2014-03-03 MED ORDER — AMLODIPINE BESY-BENAZEPRIL HCL 10-40 MG PO CAPS: 1.0000 | ORAL_CAPSULE | Freq: Every day | ORAL | Status: DC

## 2014-03-03 NOTE — Telephone Encounter (Signed)
Patient called. He states he does not have enough of amlodipine- benazepril to last until appointment in JAN 2016. RN informed patient will refill medication for 30 days. Patient verbalized understanding. [facility]

## 2014-03-11 ENCOUNTER — Ambulatory Visit (INDEPENDENT_AMBULATORY_CARE_PROVIDER_SITE_OTHER): Payer: BLUE CROSS/BLUE SHIELD | Admitting: Cardiology

## 2014-03-11 VITALS — BP 150/78 | Ht 69.0 in | Wt 200.6 lb

## 2014-03-11 DIAGNOSIS — I10 Essential (primary) hypertension: Secondary | ICD-10-CM

## 2014-03-11 DIAGNOSIS — I2510 Atherosclerotic heart disease of native coronary artery without angina pectoris: Secondary | ICD-10-CM

## 2014-03-11 DIAGNOSIS — I25709 Atherosclerosis of coronary artery bypass graft(s), unspecified, with unspecified angina pectoris: Secondary | ICD-10-CM

## 2014-03-11 DIAGNOSIS — E785 Hyperlipidemia, unspecified: Secondary | ICD-10-CM

## 2014-03-11 DIAGNOSIS — Z951 Presence of aortocoronary bypass graft: Secondary | ICD-10-CM

## 2014-03-11 DIAGNOSIS — Z79899 Other long term (current) drug therapy: Secondary | ICD-10-CM

## 2014-03-11 MED ORDER — ATORVASTATIN CALCIUM 20 MG PO TABS: 20.0000 mg | ORAL_TABLET | Freq: Every day | ORAL | Status: DC

## 2014-03-11 MED ORDER — CARVEDILOL 6.25 MG PO TABS: 6.2500 mg | ORAL_TABLET | Freq: Two times a day (BID) | ORAL | Status: DC

## 2014-03-11 NOTE — Patient Instructions (Signed)
STOP Metoprolol   START Carvedilol 6.25mg  TWICE daily START Lipitor 20mg    Your physician recommends that you return for lab work ZO:XWRUEin:MARCH along with a Nurse Visit to check your Blood Pressure  Your physician recommends that you schedule a follow-up appointment in: 1 year with Dr.Harding.

## 2014-03-12 ENCOUNTER — Telehealth: Payer: Self-pay | Admitting: Cardiology

## 2014-03-12 NOTE — Telephone Encounter (Signed)
Pt wants to know if his labs from Dr. Frederik PearHopper's office were received. Please call  Thanks

## 2014-03-12 NOTE — Telephone Encounter (Signed)
LMTCB

## 2014-03-13 ENCOUNTER — Encounter: Payer: Self-pay | Admitting: Cardiology

## 2014-03-13 NOTE — Assessment & Plan Note (Signed)
Not on statin as intended. Plan: Lipitor/cortisone 20 mg, continue Lovaza.  Check lipid panel and chemistry panel for new baseline.

## 2014-03-13 NOTE — Assessment & Plan Note (Signed)
Both vein grafts to have stents. On aspirin, but not Plavix.  Monitor for change in symptoms. Last Monday did not show ischemic changes. His lack of symptoms would tend to agree with this finding. See above for medications

## 2014-03-13 NOTE — Progress Notes (Signed)
Patient ID: Kevin Yu, male   DOB: 1950-08-19, 64 y.o.   MRN: [id number] PCP: Cathlean Cower, MD  Clinic Note: Chief Complaint  Patient presents with  . Follow-up    denies chest pain, dyspnea and swelling  . Coronary Artery Disease    CABG and PCI   HPI: Kevin Yu is a 64 y.o. male with a PMH below who presents today for delayed 1 year follow up.  I last way him in September 2014 to discuss there results of his Myoview stress test that had EKG changes suggesting ischemia, but no real imaging data to suggest that.There were 2-3 mm ST segment abnormalities that would suggest inferolateral ischemia. Scintigraphic imaging revealed mild diaphragmatic attenuation (which could also be consistent with a prior inferior lateral infarction) but no evidence of ischemia.  During my last visit he was supposed to start Lipitor 20 mg, which never got started. He is on the Lovaza, but not a statin. He also never started the HCTZ that was prescribed.  He did followup with Tenny Craw in March of 2015, but missed his followup appointment with me in the summer because of traveling to visit his brother in [place]. He saw Mr. Samara Snide with complaints of right lower extremity pain and swelling. He also was describing what seemed to be musculoskeletal chest pains that have since resolved.  A lower extremity venous Doppler did not show evidence of thrombus.  Interval History: Since his last visit he as done well.  He indicates that he is exercising routinely, and denies any chest pain or shortness of breath with rest or exertion. No PND, orthopnea or edema to suggest heart failure. With all the walking trip to [place] in [place], he denied any symptoms of any concern for angina or heart failure.  The remainder of Cardiovascular ROS: no chest pain or dyspnea on exertion negative for - edema, irregular heartbeat, loss of consciousness, murmur, orthopnea, palpitations, paroxysmal nocturnal dyspnea, rapid heart rate  or shortness of breath, syncope/near-syncope,TIA/amaurosis fugax   Past Medical History  Diagnosis Date  . H/O non-ST elevation myocardial infarction (NSTEMI) 06/2004    CATH - Multivessel CAD -->CABG X 4  . 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  . Presence of bare metal stent in left circumflex coronary artery     PCI RI: 2.21m x 12 mm Mini Vision BMS  . Presence of bypass graft stent     PCI SVG-OM: 3.0 x 18 Cypher DES  . CAD in native artery     Cath October 2007: RI-100% occluded, LAD-severe proximal 88 5%, mid 80% beyond SP1/D1 competitive flow.  Circumflex-OM1- 100 occluded.  RCA was occluded SVG-RI 100% occluded SVG-distal RCA, SVG-OM1 widely patent.  LIMA LAD patent.  .Marland KitchenCAD (coronary artery disease) of bypass graft     S/P  PCI of both SVG-OM and SVG-RI  . Hypertension   . Dyslipidemia, goal LDL below 70      formerly on Vytorin  . Erectile dysfunction     He uses Viagra  . Abnormal Nuclear Stress Test -- Myoview 10/03/2012    Exercise 8:30 min; 10.1 METS, no chest pain; 1-2 mm ST segment depression suggesting ischemia; no scintigraphic evidence of ischemia or infarction. --> Negative/low risk stress test    Prior Cardiac Evaluation and Past Surgical History: Past Surgical History  Procedure Laterality Date  . Coronary artery bypass graft  May 2006     4 vessel:  LIMA-LAD, SVG-Cx-OM,  SVG-RI , SVG-RCA  . Coronary angioplasty with stent placement      PCI to the ostial SVG-OM - 3.0 mm x 18 mm Cypher DES  . Coronary angioplasty with stent placement      Anastomotic PCI SVG-RI. -- 2.0 x 12 mm Mini Vision stent.   No Known Allergies  Current outpatient prescriptions:  .  amLODipine-benazepril (LOTREL) 10-40 MG per capsule, Take 1 capsule by mouth daily., Disp: 30 capsule, Rfl: 0 .  aspirin EC 81 MG tablet, Take 81 mg by mouth daily., Disp: , Rfl:  .  hydrochlorothiazide (HYDRODIURIL) 25 MG tablet, TAKE ONE TABLET BY MOUTH ONCE DAILY, Disp: 30 tablet,  Rfl: 2 .  omega-3 acid ethyl esters (LOVAZA) 1 G capsule, Take 1 capsule by mouth daily., Disp: , Rfl:   Metoprolol succinate 50 mg (~ he may be taking 1/2 bid) .  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   [place] now. He currently works for [employer].   The mood due to political unrest.   He is a married, father of 24, grandfather of 3.   He exercises routinely at least 2 times a week on the treadmill for maybe an hour at a time.      Last summer, he took a prolonged 3 week vacation to visit family members who live in [place].  He travelled to [place] as well.  As a result, he missed his intended appointment with me over the summer.   ROS: A comprehensive Review of Systems - was performed Review of Systems  HENT: Negative for nosebleeds.   Respiratory: Negative for cough.   Cardiovascular: Negative for claudication.  Gastrointestinal: Negative for blood in stool and melena.  Genitourinary: Negative for hematuria.  Endo/Heme/Allergies: Does not bruise/bleed easily.  Psychiatric/Behavioral:       Did not comment on how he is handling his brothers' death.  I think he has been so excited about his recent trip, that he did not have time to worry about it.   All other systems reviewed and are negative.  PHYSICAL EXAM BP 150/78 mmHg  Ht '5\' 9"'  (1.753 m)  Wt 200 lb 9.6 oz (90.992 kg)  BMI 29.61 kg/m2 General appearance: alert, cooperative, appears stated age, no distress and Healthy-appearing; well-nourished and well-groomed. Neck: no adenopathy, no carotid bruit, no JVD, supple, symmetrical, trachea midline and thyroid not enlarged, symmetric, no tenderness/mass/nodules Lungs: clear to auscultation bilaterally, normal percussion bilaterally and Nonlabored, good air movement. Heart: regular  rate and rhythm, S1, S2 normal, no S3 or S4, systolic murmur: systolic ejection 1/6, crescendo and decrescendo at 2nd right intercostal space, no click and no rub Abdomen: soft, non-tender; bowel sounds normal; no masses,  no organomegaly Extremities: extremities normal, atraumatic, no cyanosis or edema, no edema, redness or tenderness in the calves or thighs and no ulcers, gangrene or trophic changes Pulses: 2+ and symmetric Neurologic: Alert and oriented X 3, normal strength and tone. Normal symmetric reflexes. Normal coordination and gait  YME:BRAXENMMH today: Yes NSR, 65, artifact, but otherwise normal  Recent Labs: None in over one year  ASSESSMENT / PLAN: CAD in native artery --> status post CABG x4, SVG-RI occluded following PCI, other grafts open with SVG-OM stent Doing well no active anginal symptoms.  No heart failure symptoms. Evidence of infarct on his most recent Myoview, but no real evidence of ischemia. I think the EKG changes are probably related to the infarct.  He is on a beta blocker and ACE inhibitors Will Scott channel blocker. He is no longer on statin as this did not get started. He is to on Lovaza, but never changed statin to Lipitor as previously prescribed. Plan: Rx Lipitor 20 mg, convert BB to Carvedilol.   CAD (coronary artery disease) of bypass graft Both vein grafts to have stents. On aspirin, but not Plavix.  Monitor for change in symptoms. Last Monday did not show ischemic changes. His lack of symptoms would tend to agree with this finding. See above for medications   Dyslipidemia, goal LDL below 70 Not on statin as intended. Plan: Lipitor/cortisone 20 mg, continue Lovaza.  Check lipid panel and chemistry panel for new baseline.   Essential (primary) hypertension Higher blood pressure today in our light. On my recheck was 150/78 mmHg. When he comes in to get his labs checked fasting nursing visit for blood pressure check. Convert beta blocker from  metoprolol/Toprol - 2 carvedilol. Start with 6.5 mg twice a day. Pending blood pressure checked when he comes for his labs, may consider titrating carvedilol to 12.5 mg twice a day.    I will also refill his Viagra.  Orders Placed This Encounter  Procedures  . Comp Met (CMET)  . Lipid Profile  . EKG 12-Lead   New Rx: .  atorvastatin (LIPITOR) 20 MG tablet, Take 1 tablet (20 mg total) by mouth daily., Disp: 90 tablet, Rfl: 3 .  carvedilol (COREG) 6.25 MG tablet, Take 1 tablet (6.25 mg total) by mouth 2 (two) times daily., Disp: 180 tablet, Rfl: 3 D/c metoprolol   Followup: one year.Provided his blood pressure and lipids are well controlled    HARDING, Leonie Green, M.D., M.S. Interventional Cardiologist   Pager # [phone]

## 2014-03-13 NOTE — Assessment & Plan Note (Signed)
Doing well no active anginal symptoms. No heart failure symptoms. Evidence of infarct on his most recent Myoview, but no real evidence of ischemia. I think the EKG changes are probably related to the infarct.  He is on a beta blocker and ACE inhibitors Will Scott channel blocker. He is no longer on statin as this did not get started. He is to on Lovaza, but never changed statin to Lipitor as previously prescribed. Plan: Rx Lipitor 20 mg, convert BB to Carvedilol.

## 2014-03-13 NOTE — Assessment & Plan Note (Signed)
Higher blood pressure today in our light. On my recheck was 150/78 mmHg. When he comes in to get his labs checked fasting nursing visit for blood pressure check. Convert beta blocker from metoprolol/Toprol - 2 carvedilol. Start with 6.5 mg twice a day. Pending blood pressure checked when he comes for his labs, may consider titrating carvedilol to 12.5 mg twice a day.

## 2014-03-16 ENCOUNTER — Other Ambulatory Visit: Payer: Self-pay

## 2014-03-16 MED ORDER — AMLODIPINE BESY-BENAZEPRIL HCL 10-40 MG PO CAPS
1.0000 | ORAL_CAPSULE | Freq: Every day | ORAL | Status: DC
Start: 2014-03-16 — End: 2015-03-01

## 2014-03-16 MED ORDER — CARVEDILOL 6.25 MG PO TABS: 6.2500 mg | ORAL_TABLET | Freq: Two times a day (BID) | ORAL | Status: DC

## 2014-03-16 MED ORDER — HYDROCHLOROTHIAZIDE 25 MG PO TABS: 25.0000 mg | ORAL_TABLET | Freq: Every day | ORAL | Status: DC

## 2014-03-16 MED ORDER — METOPROLOL SUCCINATE ER 50 MG PO TB24: 50.0000 mg | ORAL_TABLET | Freq: Every day | ORAL | Status: DC

## 2014-03-16 MED ORDER — ATORVASTATIN CALCIUM 20 MG PO TABS: 20.0000 mg | ORAL_TABLET | Freq: Every day | ORAL | Status: DC

## 2014-03-16 NOTE — Telephone Encounter (Signed)
Rx sent to pharmacy   

## 2014-03-18 ENCOUNTER — Telehealth: Payer: Self-pay | Admitting: *Deleted

## 2014-03-18 NOTE — Telephone Encounter (Signed)
FAXED RECEIVED WITH PATIENT'S FLU VACCINATION RECORD AND LIPID PANEL RESULTS FROM 12/16/2013 TOTAL CHOL 235 HDL                  72 TRIG               246 LDL  114 TC/HDL RATIO 3.3 GLUCOSE      60

## 2014-05-05 LAB — COMPREHENSIVE METABOLIC PANEL
ALK PHOS: 50 U/L (ref 39–117)
ALT: 23 U/L (ref 0–53)
AST: 21 U/L (ref 0–37)
Albumin: 4.3 g/dL (ref 3.5–5.2)
BUN: 15 mg/dL (ref 6–23)
CHLORIDE: 101 meq/L (ref 96–112)
CO2: 34 mEq/L — ABNORMAL HIGH (ref 19–32)
Calcium: 9.1 mg/dL (ref 8.4–10.5)
Creat: 1.2 mg/dL (ref 0.50–1.35)
GLUCOSE: 103 mg/dL — AB (ref 70–99)
Potassium: 4.1 mEq/L (ref 3.5–5.3)
Sodium: 143 mEq/L (ref 135–145)
Total Bilirubin: 1 mg/dL (ref 0.2–1.2)
Total Protein: 7 g/dL (ref 6.0–8.3)

## 2014-05-05 LAB — LIPID PANEL
CHOLESTEROL: 202 mg/dL — AB (ref 0–200)
HDL: 59 mg/dL (ref >=40)
LDL Cholesterol: 131 mg/dL — ABNORMAL HIGH (ref 0–99)
TRIGLYCERIDES: 59 mg/dL (ref <150)
Total CHOL/HDL Ratio: 3.4 Ratio
VLDL: 12 mg/dL (ref 0–40)

## 2014-05-11 ENCOUNTER — Telehealth: Payer: Self-pay | Admitting: *Deleted

## 2014-05-11 NOTE — Telephone Encounter (Signed)
Left message on voice mail  to call back

## 2014-05-11 NOTE — Telephone Encounter (Signed)
-----   Message from Marykay Lexavid W Harding, MD sent at 05/06/2014  9:25 PM EST ----- Chemistry panel looks ok, but Lipids are not where we would like them. Lets increase atorvastatin to 80 mg x 3 months then reduce to 40mg .   He has 20 mg tabs - can take 4 until next Rx needs filling x 3 months.    DH

## 2014-05-19 ENCOUNTER — Encounter: Payer: Self-pay | Admitting: *Deleted

## 2014-05-19 MED ORDER — ATORVASTATIN CALCIUM 80 MG PO TABS: 80.0000 mg | ORAL_TABLET | Freq: Every day | ORAL | Status: DC

## 2014-05-19 NOTE — Telephone Encounter (Signed)
Mailed letter and  New prescription for 80 mg qhs x 3 months then 40 mg qhs

## 2014-07-06 ENCOUNTER — Telehealth: Payer: Self-pay | Admitting: Cardiology

## 2014-07-06 MED ORDER — AMLODIPINE BESY-BENAZEPRIL HCL 10-40 MG PO CAPS: 1.0000 | ORAL_CAPSULE | Freq: Every day | ORAL | Status: DC

## 2014-07-06 MED ORDER — CARVEDILOL 6.25 MG PO TABS: 6.2500 mg | ORAL_TABLET | Freq: Two times a day (BID) | ORAL | Status: DC

## 2014-07-06 NOTE — Telephone Encounter (Signed)
Amlodipine-benazepril refilled to preferred pharmacy  Metoprolol stopped at last OV Carvedilol refilled to preferred pharmacy   Attempted to contact patient. No answer on home #, unable to leave VM on cell

## 2014-07-06 NOTE — Telephone Encounter (Signed)
°  1. Which medications need to be refilled? Amlodipine and Metoprolol  2. Which pharmacy is medication to be sent to? [facility] on [address]   3. Do they need a 30 day or 90 day supply? Did not specify   4. Would they like a call back once the medication has been sent to the pharmacy? Yes because he ran out yesterday

## 2014-09-28 ENCOUNTER — Telehealth: Payer: Self-pay | Admitting: Cardiology

## 2014-09-28 MED ORDER — ATORVASTATIN CALCIUM 40 MG PO TABS: 40.0000 mg | ORAL_TABLET | Freq: Every day | ORAL | Status: DC

## 2014-09-28 NOTE — Telephone Encounter (Signed)
Kevin Yu is calling because he needs a refill on his cholesterol medication , Please send to the [facility] on [address] . Thanks

## 2014-09-28 NOTE — Telephone Encounter (Signed)
[facility]-S[facility] MED[facility]  Atorvastatin 40 mg  Daily. Patient made aware by scheduler

## 2014-12-09 ENCOUNTER — Other Ambulatory Visit: Payer: Self-pay | Admitting: Cardiology

## 2015-03-01 ENCOUNTER — Other Ambulatory Visit: Payer: Self-pay | Admitting: Cardiology

## 2015-03-01 NOTE — Telephone Encounter (Signed)
Rx request sent to pharmacy.  

## 2015-04-09 ENCOUNTER — Other Ambulatory Visit: Payer: Self-pay | Admitting: Cardiology

## 2015-05-05 ENCOUNTER — Telehealth: Payer: Self-pay | Admitting: Cardiology

## 2015-05-05 MED ORDER — CARVEDILOL 6.25 MG PO TABS: 6.2500 mg | ORAL_TABLET | Freq: Two times a day (BID) | ORAL | Status: DC

## 2015-05-05 MED ORDER — ATORVASTATIN CALCIUM 40 MG PO TABS: 40.0000 mg | ORAL_TABLET | Freq: Every day | ORAL | Status: DC

## 2015-05-05 MED ORDER — HYDROCHLOROTHIAZIDE 25 MG PO TABS: 25.0000 mg | ORAL_TABLET | Freq: Every day | ORAL | Status: DC

## 2015-05-05 MED ORDER — AMLODIPINE BESY-BENAZEPRIL HCL 10-40 MG PO CAPS: 1.0000 | ORAL_CAPSULE | Freq: Every day | ORAL | Status: DC

## 2015-05-05 NOTE — Telephone Encounter (Signed)
Refills sent w req for pt to schedule appt.

## 2015-05-05 NOTE — Telephone Encounter (Signed)
New message       *STAT* If patient is at the pharmacy, call can be transferred to refill team.   1. Which medications need to be refilled? (please list name of each medication and dose if known) amlodipine 10-40mg , atorvastatin 40mg , carvedilol 6.25mg , HCTZ 25mg , metoprolol 50mg   2. Which pharmacy/location (including street and city if local pharmacy) is medication to be sent to? [facility] [phone] 3. Do they need a 30 day or 90 day supply? 90 day supply [place] for [place] weeks and will be gone for 1 month.  He needs to take these medications with him

## 2015-06-14 ENCOUNTER — Other Ambulatory Visit: Payer: Self-pay | Admitting: Cardiology

## 2015-07-05 ENCOUNTER — Telehealth: Payer: Self-pay | Admitting: *Deleted

## 2015-07-05 NOTE — Telephone Encounter (Signed)
From [place], CMA; We received refill request from [facility] for metoprolol. This is not a current med (Dr. Herbie BaltimoreHarding took him off this med over 1+ year ago in favor of carvedilol. Pt currently taking carvedilol. Updated for pharmacy. Have called patient and left msg requesting call back - to make sure pt knows not to take metoprolol, just carvedilol.

## 2015-07-12 ENCOUNTER — Other Ambulatory Visit: Payer: Self-pay

## 2015-07-12 MED ORDER — CARVEDILOL 6.25 MG PO TABS: 6.2500 mg | ORAL_TABLET | Freq: Two times a day (BID) | ORAL | Status: DC

## 2015-08-21 ENCOUNTER — Encounter (HOSPITAL_COMMUNITY): Payer: Self-pay | Admitting: *Deleted

## 2015-08-21 ENCOUNTER — Emergency Department (HOSPITAL_COMMUNITY)
Admission: EM | Admit: 2015-08-21 | Discharge: 2015-08-21 | Disposition: A | Discharge status: Home / Self Care | Payer: BLUE CROSS/BLUE SHIELD | Attending: Emergency Medicine | Admitting: Emergency Medicine

## 2015-08-21 DIAGNOSIS — Z79899 Other long term (current) drug therapy: Secondary | ICD-10-CM | POA: Insufficient documentation

## 2015-08-21 DIAGNOSIS — Z959 Presence of cardiac and vascular implant and graft, unspecified: Secondary | ICD-10-CM | POA: Insufficient documentation

## 2015-08-21 DIAGNOSIS — Z7982 Long term (current) use of aspirin: Secondary | ICD-10-CM | POA: Insufficient documentation

## 2015-08-21 DIAGNOSIS — W268XXA Contact with other sharp object(s), not elsewhere classified, initial encounter: Secondary | ICD-10-CM | POA: Diagnosis not present

## 2015-08-21 DIAGNOSIS — Y9389 Activity, other specified: Secondary | ICD-10-CM | POA: Insufficient documentation

## 2015-08-21 DIAGNOSIS — S61212A Laceration without foreign body of right middle finger without damage to nail, initial encounter: Secondary | ICD-10-CM | POA: Diagnosis present

## 2015-08-21 DIAGNOSIS — I2510 Atherosclerotic heart disease of native coronary artery without angina pectoris: Secondary | ICD-10-CM | POA: Insufficient documentation

## 2015-08-21 DIAGNOSIS — I252 Old myocardial infarction: Secondary | ICD-10-CM | POA: Diagnosis not present

## 2015-08-21 DIAGNOSIS — I10 Essential (primary) hypertension: Secondary | ICD-10-CM | POA: Insufficient documentation

## 2015-08-21 DIAGNOSIS — S61209A Unspecified open wound of unspecified finger without damage to nail, initial encounter: Secondary | ICD-10-CM

## 2015-08-21 DIAGNOSIS — Y999 Unspecified external cause status: Secondary | ICD-10-CM | POA: Diagnosis not present

## 2015-08-21 DIAGNOSIS — Y929 Unspecified place or not applicable: Secondary | ICD-10-CM | POA: Diagnosis not present

## 2015-08-21 MED ORDER — CEPHALEXIN 500 MG PO CAPS: 500.0000 mg | ORAL_CAPSULE | Freq: Four times a day (QID) | ORAL | Status: DC

## 2015-08-21 MED ORDER — CEPHALEXIN 250 MG PO CAPS
250.0000 mg | ORAL_CAPSULE | Freq: Once | ORAL | Status: AC
  Administered 2015-08-21: 250 mg via ORAL
  Filled 2015-08-21: qty 1

## 2015-08-21 MED ORDER — TETANUS-DIPHTH-ACELL PERTUSSIS 5-2.5-18.5 LF-MCG/0.5 IM SUSP
0.5000 mL | Freq: Once | INTRAMUSCULAR | Status: AC
  Administered 2015-08-21: 0.5 mL via INTRAMUSCULAR
  Filled 2015-08-21: qty 0.5

## 2015-08-21 MED ORDER — HYDROCODONE-ACETAMINOPHEN 5-325 MG PO TABS: 1.0000 | ORAL_TABLET | ORAL | Status: DC | PRN

## 2015-08-21 MED ORDER — LIDOCAINE HCL (PF) 1 % IJ SOLN
30.0000 mL | Freq: Once | INTRAMUSCULAR | Status: AC
  Administered 2015-08-21: 30 mL
  Filled 2015-08-21: qty 30

## 2015-08-21 NOTE — Discharge Instructions (Signed)

## 2015-08-21 NOTE — ED Notes (Signed)
Pt was at work tonight and went to pick up a bottle; pt state that it was broken and he did not know it; pt with laceration to his right middle finger; area cleansed with normal saline and wrapped with gauze but bleeding continues

## 2015-08-21 NOTE — ED Notes (Signed)
Bed: WA21 Expected date:  Expected time:  Means of arrival:  Comments: 

## 2015-08-21 NOTE — ED Provider Notes (Signed)
CSN: [id number]     Arrival date & time 08/21/15  0038 History   First MD Initiated Contact with Patient 08/21/15 0150     Chief Complaint  Patient presents with  . Extremity Laceration     (Consider location/radiation/quality/duration/timing/severity/associated sxs/prior Treatment) HPI   Patient to the ER after sustaining a laceration to his right middle finger. He when to pick up a bottle not realizing it was broken and sustained a laceration. The skin is "missing". The wound continue to bleed. He denies blood thinners. He denies loss of sensation and is having significant amount of pain.    Past Medical History  Diagnosis Date  . H/O non-ST elevation myocardial infarction (NSTEMI) 06/2004    CATH - Multivessel CAD -->CABG X 4  . 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  . Presence of bare metal stent in left circumflex coronary artery     PCI RI: 2.710mm x 12 mm Mini Vision BMS  . Presence of bypass graft stent     PCI SVG-OM: 3.0 x 18 Cypher DES  . CAD in native artery     Cath October 2007: RI-100% occluded, LAD-severe proximal 88 5%, mid 80% beyond SP1/D1 competitive flow.  Circumflex-OM1- 100 occluded.  RCA was occluded SVG-RI 100% occluded SVG-distal RCA, SVG-OM1 widely patent.  LIMA LAD patent.  [name]. CAD (coronary artery disease) of bypass graft     S/P  PCI of both SVG-OM and SVG-RI  . Hypertension   . Dyslipidemia, goal LDL below 70      formerly on Vytorin  . Erectile dysfunction     He uses Viagra  . Abnormal Nuclear Stress Test -- Myoview 10/03/2012    Exercise 8:30 min; 10.1 METS, no chest pain; 1-2 mm ST segment depression suggesting ischemia; no scintigraphic evidence of ischemia or infarction. --> Negative/low risk stress test   Past Surgical History  Procedure Laterality Date  . Coronary artery bypass graft  May 2006     4 vessel:  LIMA-LAD, SVG-Cx-OM, SVG-RI , SVG-RCA  . Coronary angioplasty with stent placement      PCI to the ostial  SVG-OM - 3.0 mm x 18 mm Cypher DES  . Coronary angioplasty with stent placement      Anastomotic PCI SVG-RI. -- 2.0 x 12 mm Mini Vision stent.   No family history on file. Social History  Substance Use Topics  . Smoking status: Never Smoker   . Smokeless tobacco: None  . Alcohol Use: No    Review of Systems  Review of Systems All other systems negative except as documented in the HPI. All pertinent positives and negatives as reviewed in the HPI.   Allergies  Review of patient's allergies indicates no known allergies.  Home Medications   Prior to Admission medications   Medication Sig Start Date End Date Taking? Authorizing Provider  amLODipine-benazepril (LOTREL) 10-40 MG capsule Take 1 capsule by mouth daily. Please call and make appointment. 05/05/15   Marykay Lexavid W Harding, MD  aspirin EC 81 MG tablet Take 81 mg by mouth daily.    Historical Provider, MD  atorvastatin (LIPITOR) 40 MG tablet Take 1 tablet (40 mg total) by mouth daily. Please call and make appointment. 05/05/15   Marykay Lexavid W Harding, MD  carvedilol (COREG) 6.25 MG tablet Take 1 tablet (6.25 mg total) by mouth 2 (two) times daily. Please call and make appointment. 07/12/15   Marykay Lexavid W Harding, MD  cephALEXin (KEFLEX) 500 MG  capsule Take 1 capsule (500 mg total) by mouth 4 (four) times daily. 08/21/15   Deaven Barron Neva SeatGreene, PA-C  hydrochlorothiazide (HYDRODIURIL) 25 MG tablet Take 1 tablet (25 mg total) by mouth daily. Please call and make appointment. 05/05/15   Marykay Lexavid W Harding, MD  HYDROcodone-acetaminophen (NORCO/VICODIN) 5-325 MG tablet Take 1-2 tablets by mouth every 4 (four) hours as needed. 08/21/15   Marlon Peliffany Ryla Cauthon, PA-C  omega-3 acid ethyl esters (LOVAZA) 1 G capsule Take 1 capsule by mouth daily. 09/25/12   Historical Provider, MD  VIAGRA 100 MG tablet Take 1 tablet by mouth as needed. 10/11/12   Historical Provider, MD   BP 156/79 mmHg  Pulse 66  Temp(Src) 98.9 F (37.2 C) (Oral)  Resp 20  Wt 94.711 kg  SpO2 98% Physical Exam   Constitutional: He appears well-developed and well-nourished. No distress.  HENT:  Head: Normocephalic and atraumatic.  Eyes: Pupils are equal, round, and reactive to light.  Neck: Normal range of motion. Neck supple.  Cardiovascular: Normal rate and regular rhythm.   Pulmonary/Chest: Effort normal.  Abdominal: Soft.  Musculoskeletal:  Right middle finger ulnar tip of finger has an almost 1 cm area of avulsed skin with associated venous bleeding. The wound is actively bleeding. No arterial bleeding noted. The wound does not involve the nail bed, he still has full range of motion of the finger. No debris noted. Intact sensations.  Neurological: He is alert.  Skin: Skin is warm and dry.  Nursing note and vitals reviewed.   ED Course  Procedures (including critical care time) Labs Review Labs Reviewed - No data to display  Imaging Review No results found. I have personally reviewed and evaluated these images and lab results as part of my medical decision-making.   EKG Interpretation None      MDM   Final diagnoses:  Fingertip avulsion, initial encounter    LACERATION REPAIR Performed by: Dorthula MatasGREENE,Helga Asbury G Authorized by: Dorthula MatasGREENE,Makalyn Lennox G Consent: Verbal consent obtained. Risks and benefits: risks, benefits and alternatives were discussed Consent given by: patient Patient identity confirmed: provided demographic data Prepped and Draped in normal sterile fashion Wound explored  Laceration Location: Right middle finger  Laceration Length: 1 cm  No Foreign Bodies seen or palpated  Anesthesia: local infiltration  Local anesthetic: lidocaine 1 % without epinephrine  Anesthetic total: 1 ml  Irrigation method: syringe Amount of cleaning: standard  Skin closure: Vicryl repeating   Number of sutures: 4  Technique: simple interrupted  Patient tolerance: Patient tolerated the procedure well with no immediate complications.  Skin has been avulsed to tip of finger.  Bleeding was controlled using dissolvable sutures.  Tetanus given in ED, Keflex and pain medication. Discussed with patient that finger will heal deformed and that there is not remaining skin to approximate. Will need close follow-up with Dr. Orlan Leavensrtman.  Finger splint given for comfort.  Medications  lidocaine (PF) (XYLOCAINE) 1 % injection 30 mL (not administered)  Tdap (BOOSTRIX) injection 0.5 mL (not administered)  cephALEXin (KEFLEX) capsule 250 mg (not administered)    I discussed results, diagnoses and plan with Christell ConstantHenry Romig. They voice there understanding and questions were answered. We discussed follow-up recommendations and return precautions.    Marlon Peliffany Devlynn Knoff, PA-C 08/21/15 0414  Paula LibraJohn Molpus, MD 08/21/15 [phone]

## 2015-09-05 ENCOUNTER — Other Ambulatory Visit: Payer: Self-pay | Admitting: Cardiology

## 2015-10-05 ENCOUNTER — Other Ambulatory Visit: Payer: Self-pay

## 2015-10-05 MED ORDER — CARVEDILOL 6.25 MG PO TABS: 6.2500 mg | ORAL_TABLET | Freq: Two times a day (BID) | ORAL | 0 refills | Status: DC

## 2015-10-10 ENCOUNTER — Telehealth: Payer: Self-pay | Admitting: Cardiology

## 2015-10-10 MED ORDER — CARVEDILOL 6.25 MG PO TABS: 6.2500 mg | ORAL_TABLET | Freq: Two times a day (BID) | ORAL | 0 refills | Status: DC

## 2015-10-10 NOTE — Telephone Encounter (Signed)
Pt called back with the pharmacy phone number-[phone].

## 2015-10-10 NOTE — Telephone Encounter (Signed)
°*  STAT* If patient is at the pharmacy, call can be transferred to refill team.   1. Which medications need to be refilled? (please list name of each medication and dose if known) Carvedilol-pt still have not received it 2. Which pharmacy/location (including street and city if local pharmacy) is medication to be sent to?[facility]did not [facility]- will call back  3. Do they need a 30 day or 90 day supply? Whatever amt he can get until his appointment

## 2015-10-10 NOTE — Telephone Encounter (Signed)
Called patient - notified him that he needs an appointment w/Dr. Herbie BaltimoreHarding for refills. His last appt was 02/2014. He is agreeable to make an appointment but needs to check with his work about which day. He is aware that MD does not have available appts until October 2017. Advised he needs to call this week to schedule an appt.   Rx(s) sent to pharmacy electronically.

## 2015-11-02 ENCOUNTER — Other Ambulatory Visit: Payer: Self-pay | Admitting: Cardiology

## 2015-12-02 ENCOUNTER — Ambulatory Visit (INDEPENDENT_AMBULATORY_CARE_PROVIDER_SITE_OTHER): Payer: BLUE CROSS/BLUE SHIELD | Admitting: Cardiology

## 2015-12-02 ENCOUNTER — Encounter: Payer: Self-pay | Admitting: Cardiology

## 2015-12-02 VITALS — BP 174/78 | HR 67 | Ht 71.0 in | Wt 206.2 lb

## 2015-12-02 DIAGNOSIS — I10 Essential (primary) hypertension: Secondary | ICD-10-CM | POA: Diagnosis not present

## 2015-12-02 DIAGNOSIS — Z951 Presence of aortocoronary bypass graft: Secondary | ICD-10-CM | POA: Diagnosis not present

## 2015-12-02 DIAGNOSIS — I25708 Atherosclerosis of coronary artery bypass graft(s), unspecified, with other forms of angina pectoris: Secondary | ICD-10-CM

## 2015-12-02 DIAGNOSIS — I2510 Atherosclerotic heart disease of native coronary artery without angina pectoris: Secondary | ICD-10-CM | POA: Diagnosis not present

## 2015-12-02 DIAGNOSIS — E785 Hyperlipidemia, unspecified: Secondary | ICD-10-CM | POA: Diagnosis not present

## 2015-12-02 MED ORDER — CARVEDILOL 12.5 MG PO TABS: 12.5000 mg | ORAL_TABLET | Freq: Two times a day (BID) | ORAL | 3 refills | Status: DC

## 2015-12-02 NOTE — Patient Instructions (Addendum)
PLEASE DO LABS- CMP ,LIPID-- DO NOT EAT OR DRINK THE MORNING OF THE TEST.  Your physician wants you to follow-up in: DEC 2017 WITH HAO MENG  F/U LIPIDS,BLOODPRESSURE   Your physician wants you to follow-up in: 12 MONTHS WITH DR HARDING. You will receive a reminder letter in the mail two months in advance. If you don't receive a letter, please call our office to schedule the follow-up appointment.   If you need a refill on your cardiac medications before your next appointment, please call your pharmacy.

## 2015-12-02 NOTE — Progress Notes (Signed)
PCP: Kevin Yu  Clinic Note: No chief complaint on file.   HPI: Kevin Yu is a 65 y.o. male with a PMH below who presents today for delayed annual follow-up for CAD-CABG.. In September 24 he had a Myoview stress test that had positive EKG changes, but no scintigraphic evidence of ischemia on imaging.   Kevin Yu was last seen in January 2016 - he was doing fairly well without any major complaints. -- We switched him to atorvastatin 20 mg daily and switch his beta blocker to Coreg 6.25 mg twice a day from Toprol for hypertension.  Recent Hospitalizations: None  Studies Reviewed: None  Interval History: [name] is doing very well overall from a cardiac standpoint. 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 He states that he really didn't get good sleep in the last couple nights courses been working his second job, and his INR time falling asleep when he gets home from those late shifts. He states he may have been low all forms of his blood pressure medicines of her last couple days because of this. Otherwise he is doing well post the gym at least 2 days a week, and walks most mornings before taking a shower. Cardiac review of symptoms:  No chest pain or shortness of breath with rest or exertion.  No PND, orthopnea with only mild end of the day ankle swelling. No palpitations, lightheadedness, dizziness, weakness or syncope/near syncope. No TIA/amaurosis fugax symptoms. No claudication.  ROS: A comprehensive was performed. Review of Systems  Constitutional: Negative for malaise/fatigue.  HENT: Negative for congestion.   Respiratory: Negative for cough, shortness of breath and wheezing.   Cardiovascular: Positive for leg swelling (end of day =- mild).  Gastrointestinal: Negative for blood in stool, constipation, heartburn and melena.  Musculoskeletal: Negative for falls and myalgias.   Neurological: Negative for dizziness and headaches.  Endo/Heme/Allergies: Does not bruise/bleed easily.  Psychiatric/Behavioral: Negative for depression and memory loss. The patient does not have insomnia.   All other systems reviewed and are negative.   Past Medical History:  Diagnosis Date  . Abnormal Nuclear Stress Test -- Myoview 10/03/2012   Exercise 8:30 min; 10.1 METS, no chest pain; 1-2 mm ST segment depression suggesting ischemia; no scintigraphic evidence of ischemia or infarction. --> Negative/low risk stress test  . CAD (coronary artery disease) of bypass graft    S/P  PCI of both SVG-OM and SVG-RI  . CAD in native artery    Cath October 2007: RI-100% occluded, LAD-severe proximal 88 5%, mid 80% beyond SP1/D1 competitive flow.  Circumflex-OM1- 100 occluded.  RCA was occluded SVG-RI 100% occluded SVG-distal RCA, SVG-OM1 widely patent.  LIMA LAD patent.  . Dyslipidemia, goal LDL below 70     formerly on Vytorin  . Erectile dysfunction    He uses Viagra  . H/O non-ST elevation myocardial infarction (NSTEMI) 06/2004   CATH - Multivessel CAD -->CABG X 4  . Hypertension   . Presence of bare metal stent in left circumflex coronary artery    PCI RI: 2.59mm x 12 mm Mini Vision BMS  . Presence of bypass graft stent    PCI SVG-OM: 3.0 x 18 Cypher DES  . 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    Past Surgical History:  Procedure Laterality Date  . CORONARY ANGIOPLASTY WITH STENT PLACEMENT     PCI  to the ostial SVG-OM - 3.0 mm x 18 mm Cypher DES  . CORONARY ANGIOPLASTY WITH STENT PLACEMENT     Anastomotic PCI SVG-RI. -- 2.0 x 12 mm Mini Vision stent.  . CORONARY ARTERY BYPASS GRAFT  May 2006    4 vessel:  LIMA-LAD, SVG-Cx-OM, SVG-RI , SVG-RCA    Prior to Admission medications   Medication Sig Start Date End Date Taking? Authorizing Provider  amLODipine-benazepril (LOTREL) 10-40 MG capsule Take 1 capsule by mouth daily. 09/05/15  Yes Kevin Yu W Gray Doering, Yu    aspirin EC 81 MG tablet Take 81 mg by mouth daily.   Yes Historical Provider, Yu  atorvastatin (LIPITOR) 40 MG tablet Take 1 tablet (40 mg total) by mouth daily. Please call and make appointment. 05/05/15  Yes Kevin Yu W Marylynn Rigdon, Yu  carvedilol (COREG) 6.25 MG tablet Take 1 tablet (6.25 mg total) by mouth 2 (two) times daily. 10/10/15  Yes Kevin Yu W Loras Grieshop, Yu  cephALEXin (KEFLEX) 500 MG capsule Take 1 capsule (500 mg total) by mouth 4 (four) times daily. 08/21/15  Yes Kevin Neva SeatGreene, PA-C  hydrochlorothiazide (HYDRODIURIL) 25 MG tablet Take 1 tablet (25 mg total) by mouth daily. Please call and make appointment. 05/05/15  Yes Kevin Yu W Brewer Hitchman, Yu  HYDROcodone-acetaminophen (NORCO/VICODIN) 5-325 MG tablet Take 1-2 tablets by mouth every 4 (four) hours as needed. 08/21/15  Yes Kevin Neva SeatGreene, PA-C  omega-3 acid ethyl esters (LOVAZA) 1 G capsule Take 1 capsule by mouth daily. 09/25/12  Yes Historical Provider, Yu  VIAGRA 100 MG tablet Take 1 tablet by mouth as needed. 10/11/12  Yes Historical Provider, Yu    No Known Allergies   Social History   Social History  . Marital status: Married    Spouse name: N/A  . Number of children: N/A  . Years of education: N/A   Social History Main Topics  . Smoking status: Never Smoker  . Smokeless tobacco: None  . Alcohol use No  . Drug use: No  . Sexual activity: Not Asked   Other Topics Concern  . 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   [place] now. He currently works for [employer].   The mood due to political unrest.   He is a married, father of 7, grandfather of 3.   He exercises routinely at least 2 times a week on the treadmill for maybe an hour at a time.      He tells me that he has had a very sad U. weeks recently where he was notified that both of his brothers (1 a Astronomernational policeman, and the other an Tourist information centre managerArmy officer) died suddenly of  unexplained causes. One brother died and then during the preparation for the funeral, the other brother got sick and died on route to the hospital. Unfortunately due to the health care system in [place], he isn't not been able to find out what happened. He is very upset, because of the cost close to $10,000 for them to travel back and forth to be there for the funeral. He is not sure if he would be able to do that. He saw a sisters there for keeping them posted.   family history is not on file. His family all lives in [place] & medical care was not as advaned - he is not sure of diagnoses -- He has now buried 1 brother &  recently his sister (in April)negative  A second brother just died 2 weeks ago.  Has 3 more brothers & 4 sisters left.   Wt Readings from Last 3 Encounters:  12/02/15 93.5 kg (206 lb 3.2 oz)  08/21/15 94.7 kg (208 lb 12.8 oz)  03/11/14 91 kg (200 lb 9.6 oz)    PHYSICAL EXAM BP (!) 174/78   Pulse 67   Ht 5\' 11"  (1.803 m)   Wt 93.5 kg (206 lb 3.2 oz)   BMI 28.76 kg/m  -- did not get good sleep last night (working 2nd job) General appearance: alert, cooperative, appears stated age, no distress and Healthy-appearing; well-nourished and well-groomed. Neck: no adenopathy, no carotid bruit, no JVD, supple, symmetrical, trachea midline and thyroid not enlarged, symmetric, no tenderness/mass/nodules Lungs: clear to auscultation bilaterally, normal percussion bilaterally and Nonlabored, good air movement. Heart: regular rate and rhythm, S1 &S2 normal, no S3, + S4, systolic murmur: systolic ejection 1/6, crescendo and decrescendo at 2nd right intercostal space, no click and no rub Abdomen: soft, non-tender; bowel sounds normal; no masses,  no organomegaly Extremities: extremities normal, atraumatic, no cyanosis or edema, trace pedal edema,; no redness or tenderness in the calves or thighs and no ulcers, gangrene or trophic changes Pulses: 2+ and symmetric Neurologic: Alert and oriented X  3, normal strength and tone. Normal symmetric reflexes. Normal coordination and gait    Adult ECG Report  Rate: 67 ;  Rhythm: normal sinus rhythm and ~biatrial enlargement, NS ST-T changes.  Normal axis, intervals & durations.  Narrative Interpretation: stable EKG   Other studies Reviewed: Additional studies/ records that were reviewed today include:  Recent Labs:  Labs are checked by work (hopes to be done by November (otherwise, will give him a lab slip) Lab Results  Component Value Date   CHOL 202 (H) 05/04/2014   HDL 59 05/04/2014   LDLCALC 131 (H) 05/04/2014   TRIG 59 05/04/2014   CHOLHDL 3.4 05/04/2014    ASSESSMENT / PLAN: Problem List Items Addressed This Visit    S/P CABG x 4 (Chronic)   Relevant Orders   EKG 12-Lead   Lipid panel   Comprehensive metabolic panel   Essential (primary) hypertension (Chronic)    Still elevated today.  Plan: Increase carvedilol to 12.5 mg twice a day. We'll follow-up with PA in December timeframe to reassess lipids and hypertension.      Relevant Medications   carvedilol (COREG) 12.5 MG tablet   Other Relevant Orders   EKG 12-Lead   Lipid panel   Comprehensive metabolic panel   Dyslipidemia, goal LDL below 70 (Chronic)    He is on Lipitor 40 mg daily and Lovaza. He thinks he should be getting his blood work done by PCP or work in November.  -- We'll get him a lab slip to allow was be drawn at work or at PCP office. Further adjustment based on labs - which should be done prior to follow-up in December.  If his labs continue to be poorly controlled, would need to consider either adding Zetia or referral to the lipid clinic.      Relevant Medications   carvedilol (COREG) 12.5 MG tablet   Other Relevant Orders   EKG 12-Lead   Lipid panel   Comprehensive metabolic panel   CAD in native artery --> status post CABG x4, SVG-RI occluded following PCI, other grafts open with SVG-OM stent - Primary (Chronic)    Stable. No recurrent  symptoms. No angina or heart failure.  Despite having ST segment changes on treadmill portion of Myoview, no ischemia noted.  Plan: Continue aspirin, statin and beta blocker.        Relevant Medications   carvedilol (COREG) 12.5 MG tablet   CAD (coronary artery disease) of bypass graft (Chronic)    Both vein grafts now have stents placed.  On aspirin but no longer on Plavix. Most recent Myoview with no scintigraphic evidence of ischemia.  Based on the fact that he had an abnormal EKG portion, would not do a Treadmill Myoview in the future.      Relevant Medications   carvedilol (COREG) 12.5 MG tablet    Other Visit Diagnoses   None.     Current medicines are reviewed at length with the patient today. (+/- concerns) No concerns The following changes have been made: Increased carvedilol to 12.5 twice a day  Will give a lab slip to have lipids checked in November, if not done at work.  F/u with APP in ~Dec for BP check & f/u lipids. One-year follow-up with Dr. Herbie Baltimore  Studies Ordered:   Orders Placed This Encounter  Procedures  . Lipid panel  . Comprehensive metabolic panel  . EKG 12-Lead      Bryan Lemma, M.D., M.S. Interventional Cardiologist   Pager # [phone] Phone # [phone] [address] [place], [place] [address]

## 2015-12-04 NOTE — Assessment & Plan Note (Signed)
Still elevated today.  Plan: Increase carvedilol to 12.5 mg twice a day. We'll follow-up with PA in December timeframe to reassess lipids and hypertension.

## 2015-12-04 NOTE — Assessment & Plan Note (Addendum)
Stable. No recurrent symptoms. No angina or heart failure. Despite having ST segment changes on treadmill portion of Myoview, no ischemia noted.  Plan: Continue aspirin, statin and beta blocker.

## 2015-12-04 NOTE — Assessment & Plan Note (Addendum)
He is on Lipitor 40 mg daily and Lovaza. He thinks he should be getting his blood work done by PCP or work in November.  -- We'll get him a lab slip to allow was be drawn at work or at PCP office. Further adjustment based on labs - which should be done prior to follow-up in December.  If his labs continue to be poorly controlled, would need to consider either adding Zetia or referral to the lipid clinic.

## 2015-12-04 NOTE — Assessment & Plan Note (Signed)
Both vein grafts now have stents placed.  On aspirin but no longer on Plavix. Most recent Myoview with no scintigraphic evidence of ischemia.  Based on the fact that he had an abnormal EKG portion, would not do a Treadmill Myoview in the future.

## 2016-01-10 LAB — COMPREHENSIVE METABOLIC PANEL
ALK PHOS: 56 U/L (ref 40–115)
ALT: 31 U/L (ref 9–46)
AST: 30 U/L (ref 10–35)
Albumin: 4.4 g/dL (ref 3.6–5.1)
BILIRUBIN TOTAL: 1.5 mg/dL — AB (ref 0.2–1.2)
BUN: 18 mg/dL (ref 7–25)
CO2: 34 mmol/L — ABNORMAL HIGH (ref 20–31)
Calcium: 9.1 mg/dL (ref 8.6–10.3)
Chloride: 100 mmol/L (ref 98–110)
Creat: 1.2 mg/dL (ref 0.70–1.25)
GLUCOSE: 117 mg/dL — AB (ref 65–99)
POTASSIUM: 3.6 mmol/L (ref 3.5–5.3)
Sodium: 140 mmol/L (ref 135–146)
Total Protein: 7.5 g/dL (ref 6.1–8.1)

## 2016-01-10 LAB — LIPID PANEL
Cholesterol: 151 mg/dL (ref <200)
HDL: 70 mg/dL (ref >40)
LDL CALC: 72 mg/dL (ref <100)
Total CHOL/HDL Ratio: 2.2 Ratio (ref <5.0)
Triglycerides: 45 mg/dL (ref <150)
VLDL: 9 mg/dL (ref <30)

## 2016-01-26 ENCOUNTER — Telehealth: Payer: Self-pay | Admitting: *Deleted

## 2016-01-26 NOTE — Telephone Encounter (Signed)
-----   Message from Marykay Lexavid W Harding, MD sent at 01/23/2016  6:07 PM EST ----- Cholesterol levels look much better. Total cholesterol is reduced to 151, triglycerides are well controlled and 45. HDL is increased to 70 and LDL 72. This is pretty much at goal. I think for now we can continue with current medications.   Bryan Lemmaavid Harding, MD   Pls forward to Dr. Thurmond ButtsKpeglo

## 2016-01-26 NOTE — Telephone Encounter (Signed)
LEFT MESSAGE TO CALL BACK - IN REGARDS TO RESULTS OF LAB WORK

## 2016-01-31 ENCOUNTER — Other Ambulatory Visit: Payer: Self-pay | Admitting: Cardiology

## 2016-01-31 NOTE — Telephone Encounter (Signed)
Rx(s) sent to pharmacy electronically.  

## 2016-02-03 ENCOUNTER — Ambulatory Visit: Payer: BLUE CROSS/BLUE SHIELD | Admitting: Physician Assistant

## 2016-02-06 ENCOUNTER — Encounter: Payer: Self-pay | Admitting: *Deleted

## 2016-02-06 NOTE — Telephone Encounter (Signed)
MAIL LETTER WITH RESULTS

## 2016-02-22 ENCOUNTER — Other Ambulatory Visit: Payer: Self-pay | Admitting: *Deleted

## 2016-02-22 ENCOUNTER — Other Ambulatory Visit: Payer: Self-pay

## 2016-02-22 MED ORDER — AMLODIPINE BESY-BENAZEPRIL HCL 10-40 MG PO CAPS: 1.0000 | ORAL_CAPSULE | Freq: Every day | ORAL | 3 refills | Status: DC

## 2016-02-22 MED ORDER — ATORVASTATIN CALCIUM 40 MG PO TABS: 40.0000 mg | ORAL_TABLET | Freq: Every day | ORAL | 3 refills | Status: DC

## 2016-02-23 ENCOUNTER — Other Ambulatory Visit: Payer: Self-pay | Admitting: *Deleted

## 2016-02-23 MED ORDER — AMLODIPINE BESY-BENAZEPRIL HCL 10-40 MG PO CAPS: 1.0000 | ORAL_CAPSULE | Freq: Every day | ORAL | 3 refills | Status: DC

## 2016-02-23 NOTE — Telephone Encounter (Signed)
CALLED PT BACK TO INFORM HIM THAT THE PHARMACY HAS HIS MEDICATION AND THEY WERE PROCESSING ATORVASTATIN, HE WILL NEED TO REQUEST LOTREL.  I TRIED CALLING HIM WITH NO LUCK, LEFT A VM.

## 2016-03-05 ENCOUNTER — Ambulatory Visit (INDEPENDENT_AMBULATORY_CARE_PROVIDER_SITE_OTHER): Payer: BLUE CROSS/BLUE SHIELD | Admitting: Physician Assistant

## 2016-03-05 ENCOUNTER — Encounter: Payer: Self-pay | Admitting: Physician Assistant

## 2016-03-05 VITALS — BP 156/77 | HR 64 | Ht 71.0 in | Wt 210.0 lb

## 2016-03-05 DIAGNOSIS — E785 Hyperlipidemia, unspecified: Secondary | ICD-10-CM

## 2016-03-05 DIAGNOSIS — I10 Essential (primary) hypertension: Secondary | ICD-10-CM

## 2016-03-05 DIAGNOSIS — I25810 Atherosclerosis of coronary artery bypass graft(s) without angina pectoris: Secondary | ICD-10-CM | POA: Diagnosis not present

## 2016-03-05 DIAGNOSIS — R300 Dysuria: Secondary | ICD-10-CM

## 2016-03-05 MED ORDER — CARVEDILOL 25 MG PO TABS: 25.0000 mg | ORAL_TABLET | Freq: Two times a day (BID) | ORAL | 3 refills | Status: DC

## 2016-03-05 NOTE — Progress Notes (Signed)
Cardiology Office Note    Date:  03/05/2016   ID:  Christell Constant, DOB 16-Jul-1950, MRN [id number]  PCP:  Erlinda Hong, MD  Cardiologist:  Dr. Herbie Baltimore  Chief Complaint  Patient presents with  . Follow-up    seen for Dr. Herbie Baltimore, BP management    History of Present Illness:  Kevin Yu is a 66 y.o. male with PMH of CAD s/p CABG x 4 (LIMA-LAD, SVG-OM, SVG-RI, SVG-RCA) 05/2004, Dyslipidemia and hypertension. Last cardiac catheterization in 2007 showed patent LIMA to LAD, patent SVG to OM1, occluded SVG to ramus intermedius, last Myoview in August 2014 was read as low risk, however did have 2-3 mm ST segment depression with stress. It does not appear patient underwent repeat cardiac catheterization and he stated he was placed on medical therapy. His most recent follow-up was on 12/02/2015, at which time he was doing well from cardiology perspective.  He presents today for follow-up. He has been exercising on a daily basis. He denies any chest discomfort, dizziness or shortness of breath. He has been compliant on the higher dose of carvedilol without any side effect. He is on hydrochlorothiazide 25 mg daily and also amlodipine-benazepril combination. His blood pressure today continue to be elevated at 156/77. I will increase his carvedilol to 25 mg twice a day. He does have a digital blood pressure cuff at home, however he has not been using it. I have urged him to monitor blood pressure at home for further monitoring. He also had lipid panel drawn at his workplace in November, his total cholesterol and LDL level has significantly improved. We will continue the current therapy. He has not been taking Lovaza. He also mentions he has intermittent pain with urination, however denies any blood in the urine. He says he will continue to monitor this, I advised him that if symptoms persist, he will need to discuss with his PCP to rule out UTI.    Past Medical History:  Diagnosis Date  . Abnormal Nuclear  Stress Test -- Myoview 10/03/2012   Exercise 8:30 min; 10.1 METS, no chest pain; 1-2 mm ST segment depression suggesting ischemia; no scintigraphic evidence of ischemia or infarction. --> Negative/low risk stress test  . CAD (coronary artery disease) of bypass graft    S/P  PCI of both SVG-OM and SVG-RI  . CAD in native artery    Cath October 2007: RI-100% occluded, LAD-severe proximal 88 5%, mid 80% beyond SP1/D1 competitive flow.  Circumflex-OM1- 100 occluded.  RCA was occluded SVG-RI 100% occluded SVG-distal RCA, SVG-OM1 widely patent.  LIMA LAD patent.  . Dyslipidemia, goal LDL below 70     formerly on Vytorin  . Erectile dysfunction    He uses Viagra  . H/O non-ST elevation myocardial infarction (NSTEMI) 06/2004   CATH - Multivessel CAD -->CABG X 4  . Hypertension   . Presence of bare metal stent in left circumflex coronary artery    PCI RI: 2.76mm x 12 mm Mini Vision BMS  . Presence of bypass graft stent    PCI SVG-OM: 3.0 x 18 Cypher DES  . 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    Past Surgical History:  Procedure Laterality Date  . CORONARY ANGIOPLASTY WITH STENT PLACEMENT     PCI to the ostial SVG-OM - 3.0 mm x 18 mm Cypher DES  . CORONARY ANGIOPLASTY WITH STENT PLACEMENT     Anastomotic PCI SVG-RI. -- 2.0 x 12 mm Mini Vision stent.  [name]  CORONARY ARTERY BYPASS GRAFT  May 2006    4 vessel:  LIMA-LAD, SVG-Cx-OM, SVG-RI , SVG-RCA    Current Medications: Outpatient Medications Prior to Visit  Medication Sig Dispense Refill  . amLODipine-benazepril (LOTREL) 10-40 MG capsule Take 1 capsule by mouth daily. 90 capsule 3  . aspirin EC 81 MG tablet Take 81 mg by mouth daily.    [name] atorvastatin (LIPITOR) 40 MG tablet Take 1 tablet (40 mg total) by mouth daily at 6 PM. 90 tablet 3  . hydrochlorothiazide (HYDRODIURIL) 25 MG tablet Take 1 tablet (25 mg total) by mouth daily. Please call and make appointment. 90 tablet 0  . cephALEXin (KEFLEX) 500 MG capsule Take 1  capsule (500 mg total) by mouth 4 (four) times daily. 28 capsule 0  . HYDROcodone-acetaminophen (NORCO/VICODIN) 5-325 MG tablet Take 1-2 tablets by mouth every 4 (four) hours as needed. 12 tablet 0  . omega-3 acid ethyl esters (LOVAZA) 1 G capsule Take 1 capsule by mouth daily.    [name] VIAGRA 100 MG tablet Take 1 tablet by mouth as needed.    . carvedilol (COREG) 12.5 MG tablet Take 1 tablet (12.5 mg total) by mouth 2 (two) times daily. 180 tablet 3   No facility-administered medications prior to visit.      Allergies:   Patient has no known allergies.   Social History   Social History  . Marital status: Married    Spouse name: N/A  . Number of children: N/A  . Years of education: N/A   Social History Main Topics  . Smoking status: Never Smoker  . Smokeless tobacco: None  . Alcohol use No  . Drug use: No  . Sexual activity: Not Asked   Other Topics Concern  . 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   [place]. He currently works for [employer].   The mood due to political unrest.   He is a married, father of 7, grandfather of 3.   He exercises routinely at least 2 times a week on the treadmill for maybe an hour at a time.      He tells me that he has had a very sad U. weeks recently where he was notified that both of his brothers (1 a Astronomer, and the other an Tourist information centre manager) died suddenly of unexplained causes. One brother died and then during the preparation for the funeral, the other brother got sick and died on route to the hospital. Unfortunately due to the health care system in [place], he isn't not been able to find out what happened. He is very upset, because of the cost close to $10,000 for them to travel back and forth to be there for the funeral. He is not sure if he would be able to do that. He saw a sisters there for keeping them posted.     Family  History:  The patient's family history is not on file.   ROS:   Please see the history of present illness.    ROS All other systems reviewed and are negative.   PHYSICAL EXAM:   VS:  BP (!) 156/77   Pulse 64   Ht 5\' 11"  (1.803 m)   Wt 210 lb (95.3 kg)   BMI 29.29 kg/m    GEN: Well nourished, well developed, in no acute distress  HEENT: normal  Neck: no JVD, carotid bruits, or masses Cardiac: RRR; no murmurs, rubs, or gallops. 1+ RLE edema, trace LLE edema Respiratory:  clear to auscultation bilaterally, normal work of breathing GI: soft, nontender, nondistended, + BS MS: no deformity or atrophy  Skin: warm and dry, no rash Neuro:  Alert and Oriented x 3, Strength and sensation are intact Psych: euthymic mood, full affect  Wt Readings from Last 3 Encounters:  03/05/16 210 lb (95.3 kg)  12/02/15 206 lb 3.2 oz (93.5 kg)  08/21/15 208 lb 12.8 oz (94.7 kg)      Studies/Labs Reviewed:   EKG:  EKG is not ordered today.    Recent Labs: 01/09/2016: ALT 31; BUN 18; Creat 1.20; Potassium 3.6; Sodium 140   Lipid Panel    Component Value Date/Time   CHOL 151 01/09/2016 0857   TRIG 45 01/09/2016 0857   HDL 70 01/09/2016 0857   CHOLHDL 2.2 01/09/2016 0857   VLDL 9 01/09/2016 0857   LDLCALC 72 01/09/2016 0857    Additional studies/ records that were reviewed today include:   Myoview 10/03/2012 Impression Exercise Capacity:  Good exercise capacity. BP Response:  Hypertensive blood pressure response. Clinical Symptoms:  No chest pain; mils shortness of breath ECG Impression:   2 -3 mm ST abnormalities suggestive of ischemia. Comparison with Prior Nuclear Study: No significant change from previous study  Overall Impression:  Low risk stress nuclear study demonstrating 2 - 3 mm ST segment depression with stress and mild diaphragmatic attenuation without scintigraphic evidence for statistically significant ischemia.  LV Wall Motion:  NL LV Function, EF 57%; NL Wall  Motion   ASSESSMENT:    1. Essential hypertension   2. Coronary artery disease involving coronary bypass graft of native heart without angina pectoris   3. Hyperlipidemia, unspecified hyperlipidemia type   4. Dysuria      PLAN:  In order of problems listed above:  1. Hypertension: Continue to be uncontrolled, will increase carvedilol to 25 mg twice a day. Continue on amlodipine-benazepril combo, also will continue on 25 mg hydrochlorothiazide daily. He does have 1+ pitting edema in right lower extremity and also trace amount of edema in the left lower extremity. He denies any significant shortness breath. I have advised him to do conservative management of lower extremity edema by raising his leg at night.  2. CAD s/p CABG: No obvious angina. He exercise on a daily basis without discomfort.   3. Hyperlipidemia: On Lipitor 40 mg daily at home. Last lipid panel obtained in November 2017 showed well-controlled total cholesterol, HDL, triglycerides and LDL. LDL level is 72, significantly improved from previous 130s. We'll continue on the current management. It does not appear he has been taking Lovaza.  4. Dysuria: He says it does not occur very consistently, he also denies any blood in the urine either. I have offered him an urinalysis, he wished to observe the symptom for now. He will discuss with his PCP if symptoms persist or worsens.    Medication Adjustments/Labs and Tests Ordered: Current medicines are reviewed at length with the patient today.  Concerns regarding medicines are outlined above.  Medication changes, Labs and Tests ordered today are listed in the Patient Instructions below. Patient Instructions  Medication Instructions:  INCREASE- Carvedilol 25 mg twice a day  Labwork: None Ordered  Testing/Procedures: None Ordered  Follow-Up: Your physician recommends that you schedule a follow-up appointment in: November 2018 with Dr Herbie Baltimore   Any Other Special Instructions  Will Be Listed Below (  If Applicable).   If you need a refill on your cardiac medications before your next appointment, please call your pharmacy.      Ramond DialSigned, Zafira Munos, [place]  03/05/2016 9:03 AM    [facility] [address], [place], [place]  [address] Phone: [phone]) [phone]; Fax: [phone]

## 2016-03-05 NOTE — Patient Instructions (Signed)
Medication Instructions:  INCREASE- Carvedilol 25 mg twice a day  Labwork: None Ordered  Testing/Procedures: None Ordered  Follow-Up: Your physician recommends that you schedule a follow-up appointment in: November 2018 with Dr Herbie BaltimoreHarding   Any Other Special Instructions Will Be Listed Below (If Applicable).   If you need a refill on your cardiac medications before your next appointment, please call your pharmacy.

## 2016-07-27 ENCOUNTER — Other Ambulatory Visit: Payer: Self-pay | Admitting: Cardiology

## 2016-07-27 NOTE — Telephone Encounter (Signed)
REFILL 

## 2016-11-08 ENCOUNTER — Telehealth: Payer: Self-pay | Admitting: Cardiology

## 2016-11-08 NOTE — Telephone Encounter (Signed)
°  New Message   pt verbalized that he is calling for rn   Leave vm

## 2016-11-08 NOTE — Telephone Encounter (Signed)
Returned call to patient.He stated he is off work all next week.He wants to change appointment with Dr.Harding.Appointment scheduled with Dr.Harding 11/14/16 at 11:20 am.

## 2016-11-14 ENCOUNTER — Encounter: Payer: Self-pay | Admitting: Cardiology

## 2016-11-14 ENCOUNTER — Ambulatory Visit (INDEPENDENT_AMBULATORY_CARE_PROVIDER_SITE_OTHER): Payer: BLUE CROSS/BLUE SHIELD | Admitting: Cardiology

## 2016-11-14 VITALS — BP 149/80 | HR 71 | Ht 71.0 in | Wt 212.2 lb

## 2016-11-14 DIAGNOSIS — I2510 Atherosclerotic heart disease of native coronary artery without angina pectoris: Secondary | ICD-10-CM

## 2016-11-14 DIAGNOSIS — I10 Essential (primary) hypertension: Secondary | ICD-10-CM | POA: Diagnosis not present

## 2016-11-14 DIAGNOSIS — E785 Hyperlipidemia, unspecified: Secondary | ICD-10-CM | POA: Diagnosis not present

## 2016-11-14 DIAGNOSIS — I25709 Atherosclerosis of coronary artery bypass graft(s), unspecified, with unspecified angina pectoris: Secondary | ICD-10-CM

## 2016-11-14 NOTE — Patient Instructions (Signed)
LAB  LIPID CMP - IN NOV 2018  DO NOT EAT OR DRINK THE MORNING. NO APPOINTMENT NEEDED  HOURS 8 AM- 4 PM   NO MEDICATION CHANGES.   Your physician wants you to follow-up in 12 MONTHS WITH DR HARDING. You will receive a reminder letter in the mail two months in advance. If you don't receive a letter, please call our office to schedule the follow-up appointment.   If you need a refill on your cardiac medications before your next appointment, please call your pharmacy.

## 2016-11-14 NOTE — Progress Notes (Signed)
PCP: Kevin Hong, MD  Clinic Note: Chief Complaint  Patient presents with  . Follow-up    Pt states no Sx.   . Coronary Artery Disease    cabg    HPI: Kevin Yu is a 66 y.o. male with a PMH below who presents today for Delayed six-month follow-up for CAD-CABG.  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 (he is on telephone speaking terms with Presidents Clinton & Danae [place]) CAD s/p CABG x 4 (LIMA-LAD, SVG-OM, SVG-RI, SVG-RCA) 05/2004,   Last cardiac catheterization in 2007 showed patent LIMA to LAD, patent SVG to OM1, occluded SVG to ramus intermedius,   Myoview in August 2014 was read as low risk, however did have 2-3 mm ST segment depression with stress. -no Cath b/s no Sx.    Also has Dyslipidemia and hypertension.   Kevin Yu was last seen on By Mr. Azalee Course on January 8 - was doing well. Daily exercise without DP, DOE.  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  He was walking all over without any difficulties. He is now getting back into his workout routine with no issues.    Cardiovascular ROS: no chest pain or dyspnea on exertion negative for - edema, irregular heartbeat, loss of consciousness, murmur, orthopnea, palpitations, paroxysmal nocturnal dyspnea, rapid heart rate or shortness of breath  No TIA/amaurosis fugax symptoms. No melena, hematochezia, hematuria, or epstaxis. No claudication.  ROS: A comprehensive was performed. Review of Systems  Constitutional: Negative for malaise/fatigue.  Genitourinary: Negative for dysuria and hematuria.  Musculoskeletal: Negative  for joint pain.  Endo/Heme/Allergies: Negative for environmental allergies. Does not bruise/bleed easily.  All other systems reviewed and are negative.  I have reviewed and (if needed) personally updated the patient's problem list, medications, allergies, past medical and surgical history, social and family history.   Past Medical History:  Diagnosis Date  . Abnormal Nuclear Stress Test -- Myoview 10/03/2012   Exercise 8:30 min; 10.1 METS, no chest pain; 1-2 mm ST segment depression suggesting ischemia; no scintigraphic evidence of ischemia or infarction. --> Negative/low risk stress test  . CAD (coronary artery disease) of bypass graft    S/P  PCI of both SVG-OM and SVG-RI  . CAD in native artery    Cath October 2007: RI-100% occluded, LAD-severe proximal 88 5%, mid 80% beyond SP1/D1 competitive flow.  Circumflex-OM1- 100 occluded.  RCA was occluded SVG-RI 100% occluded SVG-distal RCA, SVG-OM1 widely patent.  LIMA LAD patent.  . Dyslipidemia, goal LDL below 70     formerly on Vytorin  . Erectile dysfunction    He uses Viagra  . H/O non-ST elevation myocardial infarction (NSTEMI) 06/2004   CATH - Multivessel CAD -->CABG X 4  . Hypertension   . Presence of bare metal stent in left circumflex coronary artery    PCI RI: 2.26mm x 12 mm Mini Vision BMS  . Presence of bypass graft stent    PCI SVG-OM: 3.0 x 18 Cypher DES  . 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    Past Surgical History:  Procedure Laterality Date  . CORONARY ANGIOPLASTY WITH STENT PLACEMENT     PCI to the ostial SVG-OM - 3.0 mm x 18 mm Cypher DES  . CORONARY ANGIOPLASTY WITH STENT PLACEMENT     Anastomotic PCI SVG-RI. -- 2.0 x 12 mm Mini Vision stent.  . CORONARY ARTERY BYPASS GRAFT  May 2006    4 vessel:  LIMA-LAD, SVG-Cx-OM, SVG-RI , SVG-RCA    Current Meds  Medication Sig  . amLODipine-benazepril (LOTREL) 10-40 MG capsule Take 1 capsule by mouth daily.  [name] aspirin EC 81 MG tablet Take 81 mg  by mouth daily.  [name] atorvastatin (LIPITOR) 40 MG tablet Take 1 tablet (40 mg total) by mouth daily at 6 PM.  . carvedilol (COREG) 25 MG tablet Take 1 tablet (25 mg total) by mouth 2 (two) times daily.  . hydrochlorothiazide (HYDRODIURIL) 25 MG tablet TAKE 1 TABLET DAILY    No Known Allergies  Social History   Social History  . Marital status: Married    Spouse name: N/A  . Number of children: N/A  . Years of education: N/A   Social History Main Topics  . Smoking status: Never Smoker  . Smokeless tobacco: Never Used  . Alcohol use No  . Drug use: No  . Sexual activity: Not Asked   Other Topics Concern  . 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   [place]. He currently works for [employer].   The mood due to political unrest.   He is a married, father of 7, grandfather of 3.   He exercises routinely at least 2 times a week on the treadmill for maybe an hour at a time.      He tells me that he has had a very sad U. weeks recently where he was notified that both of his brothers (1 a Astronomer, and the other an Tourist information centre manager) died suddenly of unexplained causes. One brother died and then during the preparation for the funeral, the other brother got sick and died on route to the hospital. Unfortunately due to the health care system in [place], he isn't not been able to find out what happened. He is very upset, because of the cost close to $10,000 for them to travel back and forth to be there for the funeral. He is not sure if he would be able to do that. He saw a sisters there for keeping them posted.    family history is not on file.  Wt Readings from Last 3 Encounters:  11/14/16 212 lb 3.2 oz (96.3 kg)  03/05/16 210 lb (95.3 kg)  12/02/15 206 lb 3.2 oz (93.5 kg)    PHYSICAL EXAM BP (!) 149/80   Pulse 71   Ht  (1.803 m)   Wt 212 lb 3.2 oz (96.3 kg)    BMI 29.60 kg/m  Physical Exam  Constitutional: He is oriented to person, place, and time. He appears well-developed and well-nourished. No distress.  Healthy appearing. Well Groomed  HENT:  Head: Normocephalic and atraumatic.  Eyes: EOM are normal. No scleral icterus.  Neck: Normal range of motion. Neck supple. No hepatojugular reflux and no JVD present. Carotid bruit is not present.  Cardiovascular: Normal rate and regular rhythm.   No extrasystoles are present. PMI is not displaced.  Exam reveals gallop (Soft S4). Exam reveals no friction rub.  Murmur (1/6 c-d SEM @ RUSB) heard. Pulmonary/Chest: Effort normal and breath sounds normal. No respiratory distress. He has no wheezes. He has no rales.  Abdominal: Soft. Bowel sounds are normal. He exhibits no distension. There is no tenderness. There is no rebound.  Musculoskeletal: Normal range of motion. He exhibits no edema.  Neurological: He is alert and oriented to person, place, and time. No cranial nerve deficit.  Skin: Skin is warm and dry. No rash noted. No erythema. No pallor.  Psychiatric: He has a normal mood and affect. His behavior is normal. Judgment and thought content normal.  Nursing note and vitals reviewed.   Heart: regular rate and rhythm, S1 &S2 normal, no S3, + S4, systolic murmur: systolic ejection1/6, crescendo and decrescendoat 2nd right intercostal space, no click and no rub  Adult ECG Report  Rate: 71 ;  Rhythm: normal sinus rhythm and LVH with Non-specific ST-T changes (repolarization).  Bi-atrial abnormality;   Narrative Interpretation: stable   Other studies Reviewed: Additional studies/ records that were reviewed today include:  Recent Labs:  -work site checked lab Lab Results  Component Value Date   CHOL 151 01/09/2016   HDL 70 01/09/2016   LDLCALC 72 01/09/2016   TRIG 45 01/09/2016   CHOLHDL 2.2 01/09/2016    ASSESSMENT / PLAN: Problem List Items Addressed This Visit    CAD (coronary artery  disease) of bypass graft (Chronic)    Both SVG have stents.  On ASA alone. No ischemia on Myoview imaging -- would not re-do TM portion. On Max dose Carvedilol & Amlodipine/benazapril. On moderate dose statin.      CAD in native artery --> status post CABG x4, SVG-RI occluded following PCI, other grafts open with SVG-OM stent - Primary (Chronic)    Despite known SVG occlusion, he remains active with no angina or CHF symptoms.  He has ischemic changes on GXT, but no ishcemia on Myoview (last in 2014) --> consider re-look Myoview before next visit.      Relevant Orders   EKG 12-Lead (Completed)   Lipid panel   Comprehensive metabolic panel   Dyslipidemia, goal LDL below 70 (Chronic)    Now on Atorvastatin - he thinks that he just had labs done through work, but not available.   I will order f/u panel with chem panel -- if he is able to get the results to [place], can hold on my order.      Relevant Orders   EKG 12-Lead (Completed)   Lipid panel   Comprehensive metabolic panel   Essential (primary) hypertension (Chronic)    Carvedilol was just increased to 25 mg BID - BP still not at target, but he indicates consistently better control @ home. Since he is on Max dose of 3 meds (2 in combo), would have to add an additional medication & he would prefer not to since his home BP readings are better.  He will monitor & if consistently stable - no change. If home pressures trend up, may need to consider diuretic.      Relevant Orders   EKG 12-Lead (Completed)   Lipid panel   Comprehensive metabolic panel      Current medicines are reviewed at length with the patient today. (+/- concerns) n/a The following changes have been made: n/a  Patient Instructions  LAB  LIPID CMP - IN NOV 2018  DO NOT EAT OR DRINK THE MORNING. NO APPOINTMENT NEEDED  HOURS 8 AM- 4 PM   NO MEDICATION CHANGES.  Your physician wants you to follow-up in 12 MONTHS WITH DR HARDING. You will receive a  reminder letter in the mail two months in advance. If you don't receive a letter, please call our office to schedule the follow-up appointment.   If you need a refill on your cardiac medications before your next appointment, please call your pharmacy.     Studies Ordered:   Orders Placed This Encounter  Procedures  . Lipid panel  . Comprehensive metabolic panel  . EKG 12-Lead      Bryan Lemma, M.D., M.S. Interventional Cardiologist   Pager # [phone] Phone # [phone] [address] [place], [place] [address]

## 2016-11-16 ENCOUNTER — Encounter: Payer: Self-pay | Admitting: Cardiology

## 2016-11-16 NOTE — Assessment & Plan Note (Signed)
Now on Atorvastatin - he thinks that he just had labs done through work, but not available.   I will order f/u panel with chem panel -- if he is able to get the results to [place], can hold on my order.

## 2016-11-16 NOTE — Assessment & Plan Note (Signed)
Carvedilol was just increased to 25 mg BID - BP still not at target, but he indicates consistently better control @ home. Since he is on Max dose of 3 meds (2 in combo), would have to add an additional medication & he would prefer not to since his home BP readings are better.  He will monitor & if consistently stable - no change. If home pressures trend up, may need to consider diuretic.

## 2016-11-16 NOTE — Assessment & Plan Note (Signed)
Both SVG have stents.  On ASA alone. No ischemia on Myoview imaging -- would not re-do TM portion. On Max dose Carvedilol & Amlodipine/benazapril. On moderate dose statin.

## 2016-11-16 NOTE — Assessment & Plan Note (Signed)
Despite known SVG occlusion, he remains active with no angina or CHF symptoms.  He has ischemic changes on GXT, but no ishcemia on Myoview (last in 2014) --> consider re-look Myoview before next visit.

## 2016-11-26 ENCOUNTER — Ambulatory Visit: Payer: BLUE CROSS/BLUE SHIELD | Admitting: Cardiology

## 2016-11-30 ENCOUNTER — Other Ambulatory Visit: Payer: Self-pay | Admitting: Cardiology

## 2016-11-30 MED ORDER — AMLODIPINE BESY-BENAZEPRIL HCL 10-40 MG PO CAPS: 1.0000 | ORAL_CAPSULE | Freq: Every day | ORAL | 3 refills | Status: DC

## 2017-01-03 ENCOUNTER — Other Ambulatory Visit: Payer: Self-pay | Admitting: *Deleted

## 2017-01-03 ENCOUNTER — Telehealth: Payer: Self-pay | Admitting: *Deleted

## 2017-01-03 DIAGNOSIS — I2510 Atherosclerotic heart disease of native coronary artery without angina pectoris: Secondary | ICD-10-CM

## 2017-01-03 DIAGNOSIS — E785 Hyperlipidemia, unspecified: Secondary | ICD-10-CM

## 2017-01-03 DIAGNOSIS — I10 Essential (primary) hypertension: Secondary | ICD-10-CM

## 2017-01-03 NOTE — Telephone Encounter (Signed)
Mailed letter and labslip 

## 2017-01-03 NOTE — Telephone Encounter (Signed)
-----   Message from Tobin ChadSharon Shanee Batch V, RN sent at 11/14/2016 12:29 PM EDT ----- LIPIDS CMP DUE 01/14/17   MAIL OCT 2018

## 2017-01-07 LAB — LIPID PANEL
CHOL/HDL RATIO: 2.1 ratio (ref 0.0–5.0)
Cholesterol, Total: 142 mg/dL (ref 100–199)
HDL: 68 mg/dL (ref >39)
LDL Calculated: 65 mg/dL (ref 0–99)
TRIGLYCERIDES: 44 mg/dL (ref 0–149)
VLDL Cholesterol Cal: 9 mg/dL (ref 5–40)

## 2017-01-07 LAB — COMPREHENSIVE METABOLIC PANEL
A/G RATIO: 1.6 (ref 1.2–2.2)
ALT: 34 IU/L (ref 0–44)
AST: 30 IU/L (ref 0–40)
Albumin: 4.4 g/dL (ref 3.6–4.8)
Alkaline Phosphatase: 60 IU/L (ref 39–117)
BUN / CREAT RATIO: 18 (ref 10–24)
BUN: 21 mg/dL (ref 8–27)
Bilirubin Total: 1.1 mg/dL (ref 0.0–1.2)
CALCIUM: 9 mg/dL (ref 8.6–10.2)
CO2: 28 mmol/L (ref 20–29)
Chloride: 101 mmol/L (ref 96–106)
Creatinine, Ser: 1.2 mg/dL (ref 0.76–1.27)
GFR, EST AFRICAN AMERICAN: 72 mL/min/{1.73_m2} (ref >59)
GFR, EST NON AFRICAN AMERICAN: 62 mL/min/{1.73_m2} (ref >59)
GLOBULIN, TOTAL: 2.7 g/dL (ref 1.5–4.5)
Glucose: 109 mg/dL — ABNORMAL HIGH (ref 65–99)
POTASSIUM: 4 mmol/L (ref 3.5–5.2)
SODIUM: 143 mmol/L (ref 134–144)
Total Protein: 7.1 g/dL (ref 6.0–8.5)

## 2017-02-15 ENCOUNTER — Telehealth: Payer: Self-pay | Admitting: *Deleted

## 2017-02-15 ENCOUNTER — Encounter: Payer: Self-pay | Admitting: *Deleted

## 2017-02-15 NOTE — Telephone Encounter (Signed)
-----   Message from Marykay Lexavid W Harding, MD sent at 02/04/2017  7:08 PM EST ----- Overall chemistry panel looks pretty good.  Kidney and liver function function are stable.  Electrolytes are stable.  Glucose is a little bit high, but stable compared to last year. Cholesterol panel looks pretty good.  Improved slightly from last year -now within goal. Continue current medications.  Bryan Lemmaavid Harding, MD  pls fwd to PCP: Erlinda HongKpeglo, Maurice, MD

## 2017-02-15 NOTE — Telephone Encounter (Signed)
lmtcb , also mailed letter with results  may call back if needed.

## 2017-05-29 ENCOUNTER — Other Ambulatory Visit: Payer: Self-pay | Admitting: *Deleted

## 2017-05-29 MED ORDER — CARVEDILOL 25 MG PO TABS: 25.0000 mg | ORAL_TABLET | Freq: Two times a day (BID) | ORAL | 1 refills | Status: DC

## 2017-11-25 ENCOUNTER — Encounter: Payer: Self-pay | Admitting: Cardiology

## 2017-11-25 ENCOUNTER — Ambulatory Visit (INDEPENDENT_AMBULATORY_CARE_PROVIDER_SITE_OTHER): Payer: BLUE CROSS/BLUE SHIELD | Admitting: Cardiology

## 2017-11-25 VITALS — BP 147/81 | HR 64 | Ht 71.0 in | Wt 210.0 lb

## 2017-11-25 DIAGNOSIS — E785 Hyperlipidemia, unspecified: Secondary | ICD-10-CM

## 2017-11-25 DIAGNOSIS — I10 Essential (primary) hypertension: Secondary | ICD-10-CM | POA: Diagnosis not present

## 2017-11-25 DIAGNOSIS — I2510 Atherosclerotic heart disease of native coronary artery without angina pectoris: Secondary | ICD-10-CM | POA: Diagnosis not present

## 2017-11-25 MED ORDER — ATORVASTATIN CALCIUM 40 MG PO TABS: ORAL_TABLET | ORAL | 3 refills | Status: DC

## 2017-11-25 MED ORDER — CHLORTHALIDONE 25 MG PO TABS: 25.0000 mg | ORAL_TABLET | Freq: Every day | ORAL | 3 refills | Status: DC

## 2017-11-25 MED ORDER — AMLODIPINE BESY-BENAZEPRIL HCL 10-40 MG PO CAPS: 1.0000 | ORAL_CAPSULE | Freq: Every day | ORAL | 3 refills | Status: DC

## 2017-11-25 NOTE — Patient Instructions (Signed)
MEDICATION INSTRUCTIONS   COMPLETE TAKING THE BOTTLE OF   HYDROCHLOROTHIAZIDE ( HCTZ) THEN STOP.  START CHLORTHALIDONE 25 MG ONE TABLET  DAILY.    LABS  IN NOV 2019 - WILL MAIL  YOU LABSLIP CMP LIPID     CONTINUE TO MONITOR YOUR BLOOD PRESSURE -   RECORD READINGS BOUT 2-3 TIMES A WEEK -- IF BLOOD PRESSURE, CONTINUALLY BE ABOVE 135/75 -- CONTACT OFFICE.     Your physician wants you to follow-up in 12 MONTHS WITH DR HARDING. You will receive a reminder letter in the mail two months in advance. If you don't receive a letter, please call our office to schedule the follow-up appointment.     If you need a refill on your cardiac medications before your next appointment, please call your pharmacy.

## 2017-11-25 NOTE — Assessment & Plan Note (Signed)
Blood pressure is high today.  He says he was a little stressed getting in here to his visit on time.  Currently he is on max dose of 3 medications. Plan: Convert from HCTZ 25 mg to chlorthalidone 25 mg which will allow [place] to then further potentially titrate up to 50 mg chlorthalidone.  He will monitor his blood pressures at home, and if they tend to be elevated, we will need to make adjustments.

## 2017-11-25 NOTE — Assessment & Plan Note (Signed)
No recurrent anginal symptoms or heart failure symptoms.  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  Nonischemic Myoview in 2014.  Since he is not having symptoms, we discussed the pros and cons of simply monitoring versus checking a screening Myoview.  He would prefer since he is feeling so well this just simply continue to treat and evaluate only if symptoms occur.  Plan: Continue current doses of amlodipine-benazepril and carvedilol.   Remains on aspirin and statin.

## 2017-11-25 NOTE — Progress Notes (Signed)
PCP: Erlinda Hong, MD  Clinic Note: Chief Complaint  Patient presents with  . Follow-up    No complaints  . Coronary Artery Disease    -CABG    HPI: Kevin Yu is a 67 y.o. 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 (he is on telephone speaking terms with Presidents Clinton & Kevin Yu)   CAD s/p CABG x 4 (LIMA-LAD, SVG-OM, SVG-RI, SVG-RCA) 05/2004,  Last cardiac catheterization in 2007 showed patent LIMA to LAD, patent SVG to OM1, occluded SVG to ramus intermedius,  Myoview in August 2014 was read as LOW RISK,  -- 2-3 mm ST segment depression with stress. -no Cath b/s no Sx.  (PLAN: for future ST - do Lexiscan to avoid EKG concern)  Also has Dyslipidemia and hypertension.   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  No SSx of Angina or CHF.  Recent Hospitalizations:   n/a  Studies Personally Reviewed - (if available, images/films reviewed: From Epic Chart or Care Everywhere)  n/a  Interval History: Continues to do well with no cardiac complaints. Has a flier from a screening clinic with lipids & BP etc from March 2019. He remains quite active - works out @ [employer] on M,Tue, Wed for ~1 hr. Occasionally on Fridays as well.  He says that he feels "strong" - denies any SSx of CP or SOB with rest or exertion.  No symptoms that remind him of his MI related angina or CHF. No PND, orthopnea or edema. No palpitations, lightheadedness, dizziness, weakness or syncope/near syncope. No TIA/amaurosis fugax symptoms. No HA or blurred vision. No melena, hematochezia, hematuria, or epstaxis. No claudication.  ROS: A comprehensive was performed. Review of Systems  Constitutional: Negative for malaise/fatigue.  HENT: Negative for nosebleeds.   Respiratory: Negative  for cough and shortness of breath.   Gastrointestinal: Negative for blood in stool and melena.  Genitourinary: Negative for hematuria.  Musculoskeletal: Negative for falls and joint pain.  Neurological: Negative for dizziness, focal weakness and weakness.  Psychiatric/Behavioral: The patient is not nervous/anxious and does not have insomnia.   All other systems reviewed and are negative.   I have reviewed and (if needed) personally updated the patient's problem list, medications, allergies, past medical and surgical history, social and family history.   Past Medical History:  Diagnosis Date  . Abnormal Nuclear Stress Test -- Myoview 10/03/2012   Exercise 8:30 min; 10.1 METS, no chest pain; 1-2 mm ST segment depression suggesting ischemia; no scintigraphic evidence of ischemia or infarction. --> Negative/low risk stress test  . CAD (coronary artery disease) of bypass graft    S/P  PCI of both SVG-OM and SVG-RI  . CAD in native artery    Cath October 2007: RI-100% occluded, LAD-severe proximal 88 5%, mid 80% beyond SP1/D1 competitive flow.  Circumflex-OM1- 100 occluded.  RCA was occluded SVG-RI 100% occluded SVG-distal RCA, SVG-OM1 widely patent.  LIMA LAD patent.  . Dyslipidemia, goal LDL below 70     formerly on Vytorin  . Erectile dysfunction    He uses Viagra  . H/O non-ST elevation myocardial infarction (NSTEMI) 06/2004   CATH - Multivessel CAD -->CABG X 4  . Hypertension   . Presence of bare metal stent in left  circumflex coronary artery    PCI RI: 2.29mm x 12 mm Mini Vision BMS  . Presence of bypass graft stent    PCI SVG-OM: 3.0 x 18 Cypher DES  . 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    Past Surgical History:  Procedure Laterality Date  . CORONARY ANGIOPLASTY WITH STENT PLACEMENT     PCI to the ostial SVG-OM - 3.0 mm x 18 mm Cypher DES  . CORONARY ANGIOPLASTY WITH STENT PLACEMENT     Anastomotic PCI SVG-RI. -- 2.0 x 12 mm Mini Vision stent.  .  CORONARY ARTERY BYPASS GRAFT  May 2006    4 vessel:  LIMA-LAD, SVG-Cx-OM, SVG-RI , SVG-RCA    Current Meds  Medication Sig  . amLODipine-benazepril (LOTREL) 10-40 MG capsule Take 1 capsule by mouth daily.  Kevin Yu aspirin EC 81 MG tablet Take 81 mg by mouth daily.  Kevin Yu atorvastatin (LIPITOR) 40 MG tablet TAKE 1 TABLET DAILY AT 6PM  . [DISCONTINUED] amLODipine-benazepril (LOTREL) 10-40 MG capsule Take 1 capsule by mouth daily.  . [DISCONTINUED] atorvastatin (LIPITOR) 40 MG tablet TAKE 1 TABLET DAILY AT 6PM  . [DISCONTINUED] hydrochlorothiazide (HYDRODIURIL) 25 MG tablet TAKE 1 TABLET DAILY    No Known Allergies  Social History   Tobacco Use  . Smoking status: Never Smoker  . Smokeless tobacco: Never Used  Substance Use Topics  . Alcohol use: No  . 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   [place] now. He currently works for [employer].   The mood due to political unrest.   He is a married, father of 7, grandfather of 3.   He exercises routinely at least 2 times a week on the treadmill for maybe an hour at a time.    family history is not on file.  Wt Readings from Last 3 Encounters:  11/25/17 210 lb (95.3 kg)  11/14/16 212 lb 3.2 oz (96.3 kg)  03/05/16 210 lb (95.3 kg)    PHYSICAL EXAM BP (!) 147/81   Pulse 64   Ht 5\' 11"  (1.803 m)   Wt 210 lb (95.3 kg)   BMI 29.29 kg/m  Physical Exam  Constitutional: He is oriented to person, place, and time. He appears well-developed and well-nourished. No distress.  Healthy appearing.  Well groomed  Neck: No hepatojugular reflux and no JVD present. Carotid bruit is not present.  Cardiovascular: Normal rate, regular rhythm and intact distal pulses.  No extrasystoles are present. PMI is not displaced. Exam reveals gallop and S4 (soft). Exam reveals no friction rub.  Murmur heard.  Harsh crescendo-decrescendo early  systolic murmur is present with a grade of 1/6 at the upper right sternal border radiating to the neck. Pulmonary/Chest: Effort normal and breath sounds normal. No respiratory distress. He has no wheezes. He has no rales.  Abdominal: Soft. Bowel sounds are normal. He exhibits no distension. There is no tenderness. There is no rebound.  Musculoskeletal: Normal range of motion. He exhibits no edema.  Neurological: He is alert and oriented to person, place, and time.  Psychiatric: He has a normal mood and affect. His behavior is normal. Judgment and thought content normal.  Vitals reviewed.    Adult ECG Report  Rate: 64 ;  Rhythm: normal sinus rhythm and non-specifict ST-T wave abnormalities (stable);  Narrative Interpretation: Stable EKG   Other studies Reviewed: Additional studies/ records that were reviewed today include:  Recent Labs:   Lab Results  Component Value Date   CHOL 142 01/07/2017   HDL 68 01/07/2017   LDLCALC 65 01/07/2017   TRIG 44 01/07/2017   CHOLHDL 2.1 01/07/2017   Lab Results  Component Value Date   CREATININE 1.20 01/07/2017   BUN 21 01/07/2017   NA 143 01/07/2017   K 4.0 01/07/2017   CL 101 01/07/2017   CO2 28 01/07/2017    ASSESSMENT / PLAN: Problem List Items Addressed This Visit    CAD in native artery --> status post CABG x4, SVG-RI occluded following PCI, other grafts open with SVG-OM stent - Primary (Chronic)    No recurrent anginal symptoms or heart failure symptoms.  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  Nonischemic Myoview in 2014.  Since he is not having symptoms, we discussed the pros and cons of simply monitoring versus checking a screening Myoview.  He would prefer since he is feeling so well this just simply continue to treat and evaluate only if symptoms occur.  Plan: Continue current doses of amlodipine-benazepril and carvedilol.   Remains on aspirin and statin.      Relevant Medications    chlorthalidone (HYGROTON) 25 MG tablet   atorvastatin (LIPITOR) 40 MG tablet   amLODipine-benazepril (LOTREL) 10-40 MG capsule   Other Relevant Orders   EKG 12-Lead   Lipid panel   Comprehensive metabolic panel   Dyslipidemia, goal LDL below 70 (Chronic)    Relatively controlled on atorvastatin by last years check.  Due for follow-up labs now.  We will recheck in roughly November timeframe.      Relevant Medications   chlorthalidone (HYGROTON) 25 MG tablet   atorvastatin (LIPITOR) 40 MG tablet   amLODipine-benazepril (LOTREL) 10-40 MG capsule   Other Relevant Orders   Lipid panel   Comprehensive metabolic panel   Essential (primary) hypertension (Chronic)    Blood pressure is high today.  He says he was a little stressed getting in here to his visit on time.  Currently he is on max dose of 3 medications. Plan: Convert from HCTZ 25 mg to chlorthalidone 25 mg which will allow [place] to then further potentially titrate up to 50 mg chlorthalidone.  He will monitor his blood pressures at home, and if they tend to be elevated, we will need to make adjustments.      Relevant Medications   chlorthalidone (HYGROTON) 25 MG tablet   atorvastatin (LIPITOR) 40 MG tablet   amLODipine-benazepril (LOTREL) 10-40 MG capsule   Other Relevant Orders   EKG 12-Lead      I spent a total of 25 minutes with the patient and chart review. >  50% of the time was spent in direct patient consultation.   Current medicines are reviewed at length with the patient today.  (+/- concerns) n/a The following changes have been made:  n/a  Patient Instructions  MEDICATION INSTRUCTIONS   COMPLETE TAKING THE BOTTLE OF   HYDROCHLOROTHIAZIDE ( HCTZ) THEN STOP.  START CHLORTHALIDONE 25 MG ONE TABLET  DAILY.    LABS  IN NOV 2019 - WILL MAIL  YOU LABSLIP CMP LIPID     CONTINUE TO MONITOR YOUR BLOOD PRESSURE -   RECORD READINGS BOUT 2-3 TIMES A WEEK -- IF BLOOD PRESSURE, CONTINUALLY BE ABOVE 135/75 -- CONTACT  OFFICE.  Your physician wants you to follow-up in 12 MONTHS WITH DR Lyle Leisner. You will receive a reminder letter in the mail two months in advance. If you don't receive a letter, please call our office to schedule the follow-up appointment.     If you need a refill on your cardiac medications before your next appointment, please call your pharmacy.        Studies Ordered:   Orders Placed This Encounter  Procedures  . Lipid panel  . Comprehensive metabolic panel  . EKG 12-Lead      Bryan Lemma, M.D., M.S. Interventional Cardiologist   Pager # [phone] Phone # [phone] [address] [place], [place] [address]   Thank you for choosing [facility] at [facility]!!

## 2017-11-25 NOTE — Assessment & Plan Note (Signed)
Relatively controlled on atorvastatin by last years check.  Due for follow-up labs now.  We will recheck in roughly November timeframe.

## 2018-01-21 ENCOUNTER — Other Ambulatory Visit: Payer: Self-pay | Admitting: Cardiology

## 2018-02-09 ENCOUNTER — Other Ambulatory Visit: Payer: Self-pay | Admitting: Cardiology

## 2018-03-03 ENCOUNTER — Telehealth: Payer: Self-pay | Admitting: Cardiology

## 2018-03-03 MED ORDER — CHLORTHALIDONE 25 MG PO TABS: 25.0000 mg | ORAL_TABLET | Freq: Every day | ORAL | 2 refills | Status: DC

## 2018-03-03 NOTE — Telephone Encounter (Signed)
Rx(s) sent to pharmacy electronically.  

## 2018-03-03 NOTE — Telephone Encounter (Signed)
New Message  Pt states the pharmacy was unable to refill this medication due to being expired and the pt says it is not suppose to be expired  *STAT* If patient is at the pharmacy, call can be transferred to refill team.   1. Which medications need to be refilled? (please list name of each medication and dose if known) chlorthalidone (HYGROTON) 25 MG tablet(Expired)  2. Which pharmacy/location (including street and city if local pharmacy) is medication to be sent to? [facility] [place] [place] - [phone] AT Portal to Registered [facility] Sites  3. Do they need a 30 day or 90 day supply? 90

## 2018-05-13 ENCOUNTER — Other Ambulatory Visit: Payer: Self-pay | Admitting: Physician Assistant

## 2019-01-09 ENCOUNTER — Encounter: Payer: Self-pay | Admitting: Cardiology

## 2019-01-09 ENCOUNTER — Ambulatory Visit (INDEPENDENT_AMBULATORY_CARE_PROVIDER_SITE_OTHER): Payer: 59 | Admitting: Cardiology

## 2019-01-09 ENCOUNTER — Other Ambulatory Visit: Payer: Self-pay

## 2019-01-09 VITALS — BP 201/98 | HR 67 | Ht 71.0 in | Wt 227.0 lb

## 2019-01-09 DIAGNOSIS — I2510 Atherosclerotic heart disease of native coronary artery without angina pectoris: Secondary | ICD-10-CM | POA: Diagnosis not present

## 2019-01-09 DIAGNOSIS — R9431 Abnormal electrocardiogram [ECG] [EKG]: Secondary | ICD-10-CM

## 2019-01-09 DIAGNOSIS — I119 Hypertensive heart disease without heart failure: Secondary | ICD-10-CM | POA: Diagnosis not present

## 2019-01-09 DIAGNOSIS — Z951 Presence of aortocoronary bypass graft: Secondary | ICD-10-CM

## 2019-01-09 DIAGNOSIS — E785 Hyperlipidemia, unspecified: Secondary | ICD-10-CM

## 2019-01-09 NOTE — Progress Notes (Signed)
Primary Care Provider: Erlinda Hong, MD Cardiologist: Bryan Lemma, MD Electrophysiologist:   Clinic Note: Chief Complaint  Patient presents with   Follow-up    Blood pressure not very controlled today, because he is quite upset about close personal loss   Coronary Artery Disease   Hypertension    HPI:    Kevin Yu is a 68 y.o. male with a PMH of CAD-CABG who presents today for annual follow-up. 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 (he is on telephone speaking terms with Presidents Clinton & Danae Orleans)   CAD s/p CABG x 4 (LIMA-LAD, SVG-OM, SVG-RI, SVG-RCA) 05/2004,   Last cardiac catheterization in 2007 showed patent LIMA to LAD, patent SVG to OM1, occluded SVG to ramus intermedius,   Myoview in August 2014 was read as LOW RISK,  -- 2-3 mm ST segment depression with stress. -no Cath b/s no Sx.  (PLAN: for future ST - do Lexiscan to avoid EKG concern)  Also has Dyslipidemia and hypertension.   Kevin Yu was last seen on 11/25/2017 -> as usual comes doing well, with no complaints.  The gym Monday Tuesday Wednesday was 1 hour arthritis.  Feels stronger. -->  Indicated that he was fine monitoring her symptoms as opposed to proceeding with surveillance Myoview  Recent Hospitalizations: None  Reviewed  CV studies:    The following studies were reviewed today: (if available, images/films reviewed: From Epic Chart or Care Everywhere)  None:  Interval History:   Kevin Yu presents here today for follow-up still quite distressed.  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  He says he has just been doing really poorly since he heard that news.  He cried all day yesterday, has not slept well for the last 2 days.  He is not even sure if he took his blood pressure  medications yesterday and today.  Otherwise from a cardiac standpoint he is doing well.  His blood pressure seems to be pretty high today, but he is feeling fine.  No chest pain or pressure.  No lightheaded dizziness or headache.  CV Review of Symptoms (Summary)  no chest pain or dyspnea on exertion negative for - edema, irregular heartbeat, orthopnea, palpitations, paroxysmal nocturnal dyspnea, rapid heart rate, shortness of breath or Syncope/near syncope, TIA/amaurosis fugax,, claudication.  The patient does not have symptoms concerning for COVID-19 infection (fever, chills, cough, or new shortness of breath).  The patient is practicing social distancing. ++ Masking.  ++ Groceries/shopping.  They are doing shift work job site, very colorful for wearing masks and using hand sanitizer.    REVIEWED OF SYSTEMS   A comprehensive ROS was performed. Review of Systems  Constitutional: Negative for malaise/fatigue (Except for today he feels little worn out because he is not slept in 2 days.) and weight loss.  HENT: Negative for congestion and nosebleeds.   Respiratory: Negative for cough, shortness of breath and wheezing.   Gastrointestinal: Negative for blood in stool, heartburn, melena and nausea.  Genitourinary: Negative for hematuria.  Musculoskeletal: Negative for falls and joint pain.  Neurological: Negative for dizziness, focal weakness, weakness and headaches.  Psychiatric/Behavioral: Negative for memory loss. The patient is not nervous/anxious and does not have insomnia (Only for the last couple days.).  See HPI very upset.  No longer tearful, clearly still taking his mentors death poorly   I have reviewed and (if needed) personally updated the patient's problem list, medications, allergies, past medical and surgical history, social and family history.   PAST MEDICAL HISTORY   Past Medical History:  Diagnosis Date   Abnormal Nuclear Stress Test -- Myoview 10/03/2012    Exercise 8:30 min; 10.1 METS, no chest pain; 1-2 mm ST segment depression suggesting ischemia; no scintigraphic evidence of ischemia or infarction. --> Negative/low risk stress test   CAD (coronary artery disease) of bypass graft    S/P  PCI of both SVG-OM and SVG-RI   CAD in native artery    Cath October 2007: RI-100% occluded, LAD-severe proximal 88 5%, mid 80% beyond SP1/D1 competitive flow.  Circumflex-OM1- 100 occluded.  RCA was occluded SVG-RI 100% occluded SVG-distal RCA, SVG-OM1 widely patent.  LIMA LAD patent.   Dyslipidemia, goal LDL below 70     formerly on Vytorin   Erectile dysfunction    He uses Viagra   H/O non-ST elevation myocardial infarction (NSTEMI) 06/2004   CATH - Multivessel CAD -->CABG X 4   Hypertension    Presence of bare metal stent in left circumflex coronary artery    PCI RI: 2.90mm x 12 mm Mini Vision BMS   Presence of bypass graft stent    PCI SVG-OM: 3.0 x 18 Cypher DES   S/P CABG x 4 06/2004   LIMA-LAD, SVG to OM, SVG-RI, SVG-RCA.     PAST SURGICAL HISTORY   Past Surgical History:  Procedure Laterality Date   CORONARY ANGIOPLASTY WITH STENT PLACEMENT     PCI to the ostial SVG-OM - 3.0 mm x 18 mm Cypher DES   CORONARY ANGIOPLASTY WITH STENT PLACEMENT     Anastomotic PCI SVG-RI. -- 2.0 x 12 mm Mini Vision stent.   CORONARY ARTERY BYPASS GRAFT  May 2006    4 vessel:  LIMA-LAD, SVG-Cx-OM, SVG-RI , SVG-RCA    MEDICATIONS/ALLERGIES   Current Meds  Medication Sig   amLODipine-benazepril (LOTREL) 10-40 MG capsule Take 1 capsule by mouth daily.   aspirin EC 81 MG tablet Take 81 mg by mouth daily.   atorvastatin (LIPITOR) 40 MG tablet TAKE 1 TABLET DAILY AT 6PM   carvedilol (COREG) 25 MG tablet Take 1 tablet (25 mg total) by mouth daily.   chlorthalidone (HYGROTON) 25 MG tablet Take 1 tablet (25 mg total) by mouth daily.    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   [place] now. He currently works for [employer].   The mood due to political unrest.   He is a married, father of 7, grandfather of 3.   He exercises routinely at least 2 times a week on the treadmill for maybe an hour at a time.   - He just found out that his former president died @ age 68 -- Kevin Yu has taken this very hard - not eating well & has not slept in ~2 days. Cried all day yesterday.  Family History family history is not on file.   OBJCTIVE -  PE, EKG, labs   Wt Readings from Last 3 Encounters:  01/09/19 227 lb (103 kg)  11/25/17 210 lb (95.3 kg)  11/14/16 212 lb 3.2 oz (96.3 kg)    Physical Exam: BP (!) 201/98    Pulse 67    Ht  (1.803 m)    Wt 227 lb (103 kg)    SpO2 99%    BMI 31.66 kg/m  Physical Exam  Constitutional: He is oriented to person, place, and time. He appears well-developed and well-nourished. No distress (Not healthwise distress, but psychologically distressed).  HENT:  Head: Normocephalic and atraumatic.  Neck: Normal range of motion. Neck supple. No JVD present. No thyromegaly present.  Cardiovascular: Normal rate, regular rhythm and intact distal pulses. Exam reveals gallop and S4. Exam reveals no friction rub.  Murmur heard. High-pitched harsh crescendo-decrescendo early systolic murmur is present with a grade of 1/6 at the upper right sternal border radiating to the neck. Pulmonary/Chest: Effort normal and breath sounds normal. No respiratory distress. He has no wheezes. He has no rales.  Abdominal: Soft. Bowel sounds are normal. He exhibits no distension. There is no abdominal tenderness. There is no rebound.  Musculoskeletal: Normal range of motion.        General: No edema.  Neurological: He  is alert and oriented to person, place, and time.  Psychiatric: His behavior is normal. Judgment and thought content normal.  Very dejected mood and affect.  Vitals reviewed.   Adult ECG Report  Rate: 67 ;  Rhythm: normal sinus rhythm and Right atrial enlargement, SEM T wave changes with T wave inversions in the lateral leads-cannot exclude ischemia versus LVH with repolarization.;   Narrative Interpretation: More pronounced T wave inversions but otherwise stable EKG  Recent Labs:  From 12/11/2018 -TC 146, HDL 58 (LDL and TG not recorded)-per his report, there was not much change  Lab Results  Component Value Date   CHOL 142 01/07/2017   HDL 68 01/07/2017   LDLCALC 65 01/07/2017   TRIG 44 01/07/2017   CHOLHDL 2.1 01/07/2017   Lab Results  Component Value Date   CREATININE 1.20 01/07/2017   BUN 21 01/07/2017   NA 143 01/07/2017   K 4.0 01/07/2017   CL 101 01/07/2017   CO2 28 01/07/2017    ASSESSMENT/PLAN    Problem List Items Addressed This Visit    CAD in native artery --> status post CABG x4, SVG-RI occluded following PCI, other grafts open with SVG-OM stent - Primary (Chronic)    No active angina or heart failure symptoms.  Despite significant elevated blood pressure, he seems to doing relatively well. He has known occlusion of the SVG-RI, but had a negative Myoview in 2014. We have talked about whether not we should do a follow-up Myoview, and in light of his elevated high blood pressure, and somewhat different changes on EKG, low threshold to consider reevaluation upon follow-up.-->  Per previous discussion, would do Lexiscan Myoview in that case (avoid concerns of EKG)  He is already on stable dose of carvedilol along with amlodipine-benazepril and chlorthalidone for his blood pressure.  He is on stable dose of atorvastatin with usually controlled lipids. . On aspirin alone.      Relevant Orders   EKG 12-Lead   S/P CABG x 4 (Chronic)   Dyslipidemia, goal LDL below 70  (Chronic)    On stable dose of atorvastatin.  I can extrapolate based on his total cholesterol and HDL levels that his LDL is  probably not as good as it was last time.  Would like to see the labs from PCP just to be sure. Otherwise continue current dose of atorvastatin with low threshold to consider converting to rosuvastatin.      Accelerated hypertension with heart disease and without congestive heart failure    BP on my recheck was already down to 166/68 mmHg (he had just taken his meds ~30 min prior to arrival here).  I have asked him to monitor his BP occasionally - keeping a log (AMs & PMs).  IF SBPs run > 160 mmHg  - take additional 1/2 tab Carvedilol   Otherwise continue other home meds for now.      Relevant Orders   EKG 12-Lead   Nonspecific abnormal electrocardiogram (ECG) (EKG)    Somewhat concerning EKG today - most likelyc/w repolarization changes of LVH, but with Lateral TWI (albeit asymmetric) cannot r/o ischemia.  Will have him monitor BP x 3 months - ROV with APP to reassess BP & EKG -- if TWI persist, would recommend Myoview ST for surveillance ischemic evaluation.  Would also consider Echo with LVH on EKG, HTN & S4 gallop  Not wanting to test today due to his current emotional state.  With no active cardiac Sx, would like to give time for him to recover from the death of his mentor.       Relevant Orders   EKG 12-Lead     --> I have asked him to monitor his blood pressures at home over the next couple months.  He will be seen in follow-up by Joni Reining, NP to reassess blood pressure.  Also, if EKG shows consistent T wave inversions, would proceed with ordering Lexiscan Myoview for surveillance. Would also attempt to view full labs from PCP.  COVID-19 Education: The signs and symptoms of COVID-19 were discussed with the patient and how to seek care for testing (follow up with PCP or arrange E-visit).   The importance of social distancing was discussed  today.  I spent a total of 21 minutes with the patient and chart review. >  50% of the time was spent in direct patient consultation.  Additional time spent with chart review (studies, outside notes, etc): 5 Total Time: 26 min   Current medicines are reviewed at length with the patient today.  (+/- concerns) n/a   Patient Instructions / Medication Changes & Studies & Tests Ordered   Patient Instructions  Medication Instructions:  Continue with current medications- except  Monitor blood pressures if systolic blood pressure is equal or above 160 / ? Take an extra 1/2 tablet of carvedilol  With the next regular dose . ( check couple days blood pressure  Weekly until you come back for office visit )  Tonight - take an extra 1/2 dose of carvedilol .  *If you need a refill on your cardiac medications before your next appointment, please call your pharmacy*  Lab Work: Not needed   Testing/Procedures: Not needed  Follow-Up: At [facility], you and your health needs are our priority.  As part of our continuing mission to provide you with exceptional heart care, we have created designated Provider Care Teams.  These Care Teams include your primary Cardiologist (physician) and Advanced Practice Providers (APPs -  Physician Assistants and Nurse Practitioners) who all work together to provide you with the care you need, when you need it.  Your next appointment:   3 months blood pressure   The format for your  next appointment:   In Person  Provider:   Jory Sims, DNP, ANP  Other Instructions    Studies Ordered:   Orders Placed This Encounter  Procedures   EKG 12-Lead     Glenetta Hew, M.D., M.S. Interventional Cardiologist   Pager # [phone] Phone # [phone] [address] [place], [place] [address]    Thank you for choosing Heartcare at [facility]!!

## 2019-01-09 NOTE — Assessment & Plan Note (Signed)
BP on my recheck was already down to 166/68 mmHg (he had just taken his meds ~30 min prior to arrival here).  I have asked him to monitor his BP occasionally - keeping a log (AMs & PMs).  IF SBPs run > 160 mmHg  - take additional 1/2 tab Carvedilol   Otherwise continue other home meds for now.

## 2019-01-09 NOTE — Patient Instructions (Signed)
Medication Instructions:  Continue with current medications- except  Monitor blood pressures if systolic blood pressure is equal or above 160 / ? Take an extra 1/2 tablet of carvedilol  With the next regular dose . ( check couple days blood pressure  Weekly until you come back for office visit )  Tonight - take an extra 1/2 dose of carvedilol .  *If you need a refill on your cardiac medications before your next appointment, please call your pharmacy*  Lab Work: Not needed   Testing/Procedures: Not needed  Follow-Up: At [facility], you and your health needs are our priority.  As part of our continuing mission to provide you with exceptional heart care, we have created designated Provider Care Teams.  These Care Teams include your primary Cardiologist (physician) and Advanced Practice Providers (APPs -  Physician Assistants and Nurse Practitioners) who all work together to provide you with the care you need, when you need it.  Your next appointment:   3 months blood pressure   The format for your next appointment:   In Person  Provider:   Jory Sims, DNP, ANP  Other Instructions

## 2019-01-09 NOTE — Assessment & Plan Note (Signed)
Somewhat concerning EKG today - most likelyc/w repolarization changes of LVH, but with Lateral TWI (albeit asymmetric) cannot r/o ischemia.  Will have him monitor BP x 3 months - ROV with APP to reassess BP & EKG -- if TWI persist, would recommend Myoview ST for surveillance ischemic evaluation.  Would also consider Echo with LVH on EKG, HTN & S4 gallop  Not wanting to test today due to his current emotional state.  With no active cardiac Sx, would like to give time for him to recover from the death of his mentor.

## 2019-01-11 ENCOUNTER — Encounter: Payer: Self-pay | Admitting: Cardiology

## 2019-01-11 NOTE — Assessment & Plan Note (Signed)
No active angina or heart failure symptoms.  Despite significant elevated blood pressure, he seems to doing relatively well. He has known occlusion of the SVG-RI, but had a negative Myoview in 2014. We have talked about whether not we should do a follow-up Myoview, and in light of his elevated high blood pressure, and somewhat different changes on EKG, low threshold to consider reevaluation upon follow-up.-->  Per previous discussion, would do Lexiscan Myoview in that case (avoid concerns of EKG)  He is already on stable dose of carvedilol along with amlodipine-benazepril and chlorthalidone for his blood pressure.  He is on stable dose of atorvastatin with usually controlled lipids. . On aspirin alone.

## 2019-01-11 NOTE — Assessment & Plan Note (Addendum)
On stable dose of atorvastatin.  I can extrapolate based on his total cholesterol and HDL levels that his LDL is probably not as good as it was last time.  Would like to see the labs from PCP just to be sure. Otherwise continue current dose of atorvastatin with low threshold to consider converting to rosuvastatin.

## 2019-01-16 ENCOUNTER — Other Ambulatory Visit: Payer: Self-pay | Admitting: Cardiology

## 2019-01-16 NOTE — Telephone Encounter (Signed)
Rx(s) sent to pharmacy electronically.  

## 2019-01-20 ENCOUNTER — Other Ambulatory Visit: Payer: Self-pay | Admitting: Cardiology

## 2019-01-20 MED ORDER — CARVEDILOL 25 MG PO TABS: 25.0000 mg | ORAL_TABLET | Freq: Every day | ORAL | 0 refills | Status: DC

## 2019-01-20 NOTE — Telephone Encounter (Signed)
°*  STAT* If patient is at the pharmacy, call can be transferred to refill team.   1. Which medications need to be refilled? (please list name of each medication and dose if known) carvedilol (COREG) 25 MG tablet  chlorthalidone (HYGROTON) 25 MG tablet    2. Which pharmacy/location (including street and city if local pharmacy) is medication to be sent to? [facility] [place] [place] AT Portal to Registered [facility] Sites  3. Do they need a 30 day or 90 day supply? 90 days

## 2019-01-28 ENCOUNTER — Other Ambulatory Visit: Payer: Self-pay | Admitting: Cardiology

## 2019-01-28 ENCOUNTER — Other Ambulatory Visit: Payer: Self-pay | Admitting: Physician Assistant

## 2019-04-09 NOTE — Progress Notes (Deleted)
Cardiology Office Note   Date:  04/09/2019   ID:  Kevin Yu, DOB Jul 26, 1950, MRN [id number]  PCP:  Erlinda Hong, MD  Cardiologist:  Dr. Herbie Baltimore  No chief complaint on file.    History of Present Illness: Kevin Yu is a 69 y.o. male who presents for ongoing assessment and management of coronary artery disease with history of CABG x4, LIMA to LAD, SVG to OM, SVG to RI, SVG to RCA, and 05/2004.  Most recent Myoview in August 2014 read as low risk.  Last seen by Dr. Herbie Baltimore on 01/09/2019, was found to be hypertensive.  He was rechecked in the office after resting, and found to be 166/68.  He was asked to keep a blood pressure log and to report blood pressures greater than 160 mmHg systolic, he was to take an additional half a tablet of carvedilol if this occurred.  He has here for follow-up concerning his response to medication and blood pressure control.  Past Medical History:  Diagnosis Date  . Abnormal Nuclear Stress Test -- Myoview 10/03/2012   Exercise 8:30 min; 10.1 METS, no chest pain; 1-2 mm ST segment depression suggesting ischemia; no scintigraphic evidence of ischemia or infarction. --> Negative/low risk stress test  . CAD (coronary artery disease) of bypass graft    S/P  PCI of both SVG-OM and SVG-RI  . CAD in native artery    Cath October 2007: RI-100% occluded, LAD-severe proximal 88 5%, mid 80% beyond SP1/D1 competitive flow.  Circumflex-OM1- 100 occluded.  RCA was occluded SVG-RI 100% occluded SVG-distal RCA, SVG-OM1 widely patent.  LIMA LAD patent.  . Dyslipidemia, goal LDL below 70     formerly on Vytorin  . Erectile dysfunction    He uses Viagra  . H/O non-ST elevation myocardial infarction (NSTEMI) 06/2004   CATH - Multivessel CAD -->CABG X 4  . Hypertension   . Presence of bare metal stent in left circumflex coronary artery    PCI RI: 2.61mm x 12 mm Mini Vision BMS  . Presence of bypass graft stent    PCI SVG-OM: 3.0 x 18 Cypher DES  . 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    Past Surgical History:  Procedure Laterality Date  . CORONARY ANGIOPLASTY WITH STENT PLACEMENT     PCI to the ostial SVG-OM - 3.0 mm x 18 mm Cypher DES  . CORONARY ANGIOPLASTY WITH STENT PLACEMENT     Anastomotic PCI SVG-RI. -- 2.0 x 12 mm Mini Vision stent.  . CORONARY ARTERY BYPASS GRAFT  May 2006    4 vessel:  LIMA-LAD, SVG-Cx-OM, SVG-RI , SVG-RCA     Current Outpatient Medications  Medication Sig Dispense Refill  . amLODipine-benazepril (LOTREL) 10-40 MG capsule Take 1 capsule by mouth daily. 90 capsule 3  . aspirin EC 81 MG tablet Take 81 mg by mouth daily.    [name] atorvastatin (LIPITOR) 40 MG tablet Take 1 tablet (40 mg total) by mouth daily at 6 PM. TAKE 1 TABLET DAILY AT 6PM 90 tablet 3  . carvedilol (COREG) 25 MG tablet TAKE 1 TABLET DAILY 90 tablet 3  . chlorthalidone (HYGROTON) 25 MG tablet TAKE 1 TABLET DAILY 90 tablet 3   No current facility-administered medications for this visit.    Allergies:   Patient has no known allergies.    Social History:  The patient  reports that he has never smoked. He has never used smokeless tobacco. He reports that he does not drink  alcohol or use drugs.   Family History:  The patient's family history is not on file.    ROS: All other systems are reviewed and negative. Unless otherwise mentioned in H&P    PHYSICAL EXAM: VS:  There were no vitals taken for this visit. , BMI There is no height or weight on file to calculate BMI. GEN: Well nourished, well developed, in no acute distress HEENT: normal Neck: no JVD, carotid bruits, or masses Cardiac: ***RRR; no murmurs, rubs, or gallops,no edema  Respiratory:  Clear to auscultation bilaterally, normal work of breathing GI: soft, nontender, nondistended, + BS MS: no deformity or atrophy Skin: warm and dry, no rash Neuro:  Strength and sensation are intact Psych: euthymic mood, full affect   EKG:  EKG {ACTION; IS/IS [id number]} ordered  today. The ekg ordered today demonstrates ***   Recent Labs: No results found for requested labs within last 8760 hours.    Lipid Panel    Component Value Date/Time   CHOL 142 01/07/2017 1014   TRIG 44 01/07/2017 1014   HDL 68 01/07/2017 1014   CHOLHDL 2.1 01/07/2017 1014   CHOLHDL 2.2 01/09/2016 0857   VLDL 9 01/09/2016 0857   LDLCALC 65 01/07/2017 1014      Wt Readings from Last 3 Encounters:  01/09/19 227 lb (103 kg)  11/25/17 210 lb (95.3 kg)  11/14/16 212 lb 3.2 oz (96.3 kg)      Other studies Reviewed: Additional studies/ records that were reviewed today include: ***. Review of the above records demonstrates: ***   ASSESSMENT AND PLAN:  1.  ***   Current medicines are reviewed at length with the patient today.    Labs/ tests ordered today include: *** [name]. West Pugh, ANP, [facility]   04/09/2019 8:26 AM    [facility] [place] [address] Office [phone] Fax [phone]  Notice: This dictation was prepared with Dragon dictation along with smaller phrase technology. Any transcriptional errors that result from this process are unintentional and may not be corrected upon review.

## 2019-04-13 ENCOUNTER — Ambulatory Visit: Payer: 59 | Admitting: Adult Health

## 2019-05-03 ENCOUNTER — Ambulatory Visit: Payer: 59 | Attending: Internal Medicine

## 2019-05-03 DIAGNOSIS — Z23 Encounter for immunization: Secondary | ICD-10-CM | POA: Insufficient documentation

## 2019-05-03 NOTE — Progress Notes (Signed)
   Covid-19 Vaccination Clinic  Name:  Jahfari Ambers    MRN: [id number] DOB: 04/08/1950  05/03/2019  Mr. Mehringer was observed post Covid-19 immunization for 15 minutes without incident. He was provided with Vaccine Information Sheet and instruction to access the V-Safe system.   Mr. Dollinger was instructed to call 911 with any severe reactions post vaccine: [name] Difficulty breathing  . Swelling of face and throat  . A fast heartbeat  . A bad rash all over body  . Dizziness and weakness   Immunizations Administered    Name Date Dose VIS Date Route   Pfizer COVID-19 Vaccine 05/03/2019  8:12 AM 0.3 mL 02/06/2019 Intramuscular   Manufacturer: [employer], [employer]   Lot: [id number]   NDC: [address]-4

## 2019-05-04 ENCOUNTER — Ambulatory Visit: Payer: 59 | Admitting: Adult Health

## 2019-05-23 NOTE — Progress Notes (Deleted)
Cardiology Office Note   Date:  05/23/2019   ID:  Kevin Yu, DOB 10-11-1950, MRN [id number]  PCP:  Erlinda Hong, MD  Cardiologist:  Dr. Herbie Baltimore  No chief complaint on file.    History of Present Illness: Kevin Yu is a 69 y.o. male who presents for ongoing assessment and management of CAD with hx of CABG.   Per Dr. 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 (he is on telephone speaking terms with Presidents Clinton &Bush)   CAD s/p CABG x 4 (LIMA-LAD, SVG-OM, SVG-RI, SVG-RCA) 05/2004,   Last cardiac catheterization in 2007 showed patent LIMA to LAD, patent SVG to OM1, occluded SVG to ramus intermedius,   Myoview in August 2014 was read asLOW RISK,--2-3 mm ST segment depression with stress. -no Cath b/s no Sx. (PLAN: for future ST - do Lexiscan to avoid EKG concern)  Also has Dyslipidemia and hypertension.  He was last seen in the office by Dr. Herbie Baltimore on annual follow up on 01/09/2019. At that time he was very upset, as his former boss, the former president, had died. He was hypertensive.  He was not complaining of chest pain. He was continued on his current regimen. He was to monitor his BP for the next 3 months. EKG revealed TWI laterally. He will have repeat echo, and possible stress test if EKG remained abnormal.   Past Medical History:  Diagnosis Date  . Abnormal Nuclear Stress Test -- Myoview 10/03/2012   Exercise 8:30 min; 10.1 METS, no chest pain; 1-2 mm ST segment depression suggesting ischemia; no scintigraphic evidence of ischemia or infarction. --> Negative/low risk stress test  . CAD (coronary artery disease) of bypass graft    S/P  PCI of both SVG-OM and SVG-RI  . CAD in native artery    Cath October 2007: RI-100% occluded, LAD-severe proximal 88 5%, mid 80% beyond SP1/D1 competitive flow.  Circumflex-OM1- 100 occluded.  RCA was occluded SVG-RI 100% occluded SVG-distal  RCA, SVG-OM1 widely patent.  LIMA LAD patent.  . Dyslipidemia, goal LDL below 70     formerly on Vytorin  . Erectile dysfunction    He uses Viagra  . H/O non-ST elevation myocardial infarction (NSTEMI) 06/2004   CATH - Multivessel CAD -->CABG X 4  . Hypertension   . Presence of bare metal stent in left circumflex coronary artery    PCI RI: 2.19mm x 12 mm Mini Vision BMS  . Presence of bypass graft stent    PCI SVG-OM: 3.0 x 18 Cypher DES  . 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    Past Surgical History:  Procedure Laterality Date  . CORONARY ANGIOPLASTY WITH STENT PLACEMENT     PCI to the ostial SVG-OM - 3.0 mm x 18 mm Cypher DES  . CORONARY ANGIOPLASTY WITH STENT PLACEMENT     Anastomotic PCI SVG-RI. -- 2.0 x 12 mm Mini Vision stent.  . CORONARY ARTERY BYPASS GRAFT  May 2006    4 vessel:  LIMA-LAD, SVG-Cx-OM, SVG-RI , SVG-RCA     Current Outpatient Medications  Medication Sig Dispense Refill  . amLODipine-benazepril (LOTREL) 10-40 MG capsule Take 1 capsule by mouth daily. 90 capsule 3  . aspirin EC 81 MG tablet Take 81 mg by mouth daily.    [name] atorvastatin (LIPITOR) 40 MG tablet Take 1 tablet (40 mg total) by mouth daily at 6 PM.  TAKE 1 TABLET DAILY AT 6PM 90 tablet 3  . carvedilol (COREG) 25 MG tablet TAKE 1 TABLET DAILY 90 tablet 3  . chlorthalidone (HYGROTON) 25 MG tablet TAKE 1 TABLET DAILY 90 tablet 3   No current facility-administered medications for this visit.    Allergies:   Patient has no known allergies.    Social History:  The patient  reports that he has never smoked. He has never used smokeless tobacco. He reports that he does not drink alcohol or use drugs.   Family History:  The patient's family history is not on file.    ROS: All other systems are reviewed and negative. Unless otherwise mentioned in H&P    PHYSICAL EXAM: VS:  There were no vitals taken for this visit. , BMI There is no height or weight on file to calculate  BMI. GEN: Well nourished, well developed, in no acute distress HEENT: normal Neck: no JVD, carotid bruits, or masses Cardiac: ***RRR; no murmurs, rubs, or gallops,no edema  Respiratory:  Clear to auscultation bilaterally, normal work of breathing GI: soft, nontender, nondistended, + BS MS: no deformity or atrophy Skin: warm and dry, no rash Neuro:  Strength and sensation are intact Psych: euthymic mood, full affect   EKG:  EKG {ACTION; IS/IS [id number]} ordered today. The ekg ordered today demonstrates ***   Recent Labs: No results found for requested labs within last 8760 hours.    Lipid Panel    Component Value Date/Time   CHOL 142 01/07/2017 1014   TRIG 44 01/07/2017 1014   HDL 68 01/07/2017 1014   CHOLHDL 2.1 01/07/2017 1014   CHOLHDL 2.2 01/09/2016 0857   VLDL 9 01/09/2016 0857   LDLCALC 65 01/07/2017 1014      Wt Readings from Last 3 Encounters:  01/09/19 227 lb (103 kg)  11/25/17 210 lb (95.3 kg)  11/14/16 212 lb 3.2 oz (96.3 kg)      Other studies Reviewed: Stress Myoview 28-Oct-2012 Impression Exercise Capacity:  Good exercise capacity. BP Response:  Hypertensive blood pressure response. Clinical Symptoms:  No chest pain; mils shortness of breath ECG Impression:   2 -3 mm ST abnormalities suggestive of ischemia. Comparison with Prior Nuclear Study: No significant change from previous study  Overall Impression:  Low risk stress nuclear study demonstrating 2 - 3 mm ST segment depression with stress and mild diaphragmatic attenuation without scintigraphic evidence for statistically significant ischemia   ASSESSMENT AND PLAN:  1.  ***   Current medicines are reviewed at length with the patient today.  I have spent *** dedicated to the care of this patient on the date of this encounter to include pre-visit review of records, assessment, management and diagnostic testing,with shared decision making.  Labs/ tests ordered today include: *** [name].  West Pugh, ANP, AACC   05/23/2019 6:46 PM    [address] Office [phone] Fax [phone]  Notice: This dictation was prepared with Dragon dictation along with smaller phrase technology. Any transcriptional errors that result from this process are unintentional and may not be corrected upon review.

## 2019-05-25 ENCOUNTER — Ambulatory Visit: Payer: 59 | Admitting: Adult Health

## 2019-06-02 ENCOUNTER — Ambulatory Visit: Payer: 59 | Attending: Internal Medicine

## 2019-06-02 DIAGNOSIS — Z23 Encounter for immunization: Secondary | ICD-10-CM

## 2019-06-02 NOTE — Progress Notes (Signed)
   Covid-19 Vaccination Clinic  Name:  Kevin Yu    MRN: [id number] DOB: 1950/04/23  06/02/2019  [name] was observed post Covid-19 immunization for 15 minutes without incident. He was provided with Vaccine Information Sheet and instruction to access the V-Safe system.   [name] was instructed to call 911 with any severe reactions post vaccine: [name] Difficulty breathing  . Swelling of face and throat  . A fast heartbeat  . A bad rash all over body  . Dizziness and weakness   Immunizations Administered    Name Date Dose VIS Date Route   Pfizer COVID-19 Vaccine 06/02/2019 10:32 AM 0.3 mL 02/06/2019 Intramuscular   Manufacturer: [employer], [employer]   Lot: [id number]   NDC: [address]-2

## 2019-06-08 ENCOUNTER — Encounter: Payer: Self-pay | Admitting: Adult Health

## 2019-07-01 ENCOUNTER — Other Ambulatory Visit: Payer: Self-pay | Admitting: Nephrology

## 2019-07-01 DIAGNOSIS — N1831 Chronic kidney disease, stage 3a: Secondary | ICD-10-CM

## 2020-01-11 ENCOUNTER — Other Ambulatory Visit: Payer: Self-pay | Admitting: Cardiology

## 2020-03-04 ENCOUNTER — Other Ambulatory Visit: Payer: Self-pay

## 2020-03-04 MED ORDER — CHLORTHALIDONE 25 MG PO TABS: 25.0000 mg | ORAL_TABLET | Freq: Every day | ORAL | 0 refills | Status: DC

## 2020-04-05 ENCOUNTER — Other Ambulatory Visit: Payer: Self-pay | Admitting: Cardiology

## 2020-05-11 ENCOUNTER — Other Ambulatory Visit: Payer: Self-pay | Admitting: Cardiology

## 2020-06-02 ENCOUNTER — Other Ambulatory Visit: Payer: Self-pay | Admitting: Cardiology

## 2020-08-26 ENCOUNTER — Other Ambulatory Visit: Payer: Self-pay | Admitting: Cardiology

## 2020-09-29 ENCOUNTER — Other Ambulatory Visit: Payer: Self-pay | Admitting: Cardiology

## 2020-11-01 ENCOUNTER — Other Ambulatory Visit: Payer: Self-pay | Admitting: Cardiology

## 2020-12-15 ENCOUNTER — Other Ambulatory Visit: Payer: Self-pay | Admitting: Cardiology

## 2021-06-13 ENCOUNTER — Other Ambulatory Visit: Payer: Self-pay

## 2021-06-29 ENCOUNTER — Other Ambulatory Visit: Payer: Self-pay

## 2021-06-29 MED ORDER — CARVEDILOL 25 MG PO TABS: ORAL_TABLET | ORAL | 1 refills | Status: DC

## 2021-08-07 ENCOUNTER — Other Ambulatory Visit: Payer: Self-pay | Admitting: Cardiology

## 2021-12-18 ENCOUNTER — Other Ambulatory Visit: Payer: Self-pay | Admitting: Cardiology

## 2022-01-10 ENCOUNTER — Encounter: Payer: Self-pay | Admitting: Internal Medicine

## 2022-01-10 ENCOUNTER — Ambulatory Visit: Payer: 59 | Attending: Internal Medicine | Admitting: Internal Medicine

## 2022-01-10 VITALS — BP 130/60 | HR 59 | Ht 71.0 in | Wt 212.4 lb

## 2022-01-10 DIAGNOSIS — I25709 Atherosclerosis of coronary artery bypass graft(s), unspecified, with unspecified angina pectoris: Secondary | ICD-10-CM | POA: Diagnosis not present

## 2022-01-10 MED ORDER — ATORVASTATIN CALCIUM 40 MG PO TABS: ORAL_TABLET | ORAL | 3 refills | Status: DC

## 2022-01-10 MED ORDER — CHLORTHALIDONE 25 MG PO TABS: 25.0000 mg | ORAL_TABLET | Freq: Every day | ORAL | 3 refills | Status: DC

## 2022-01-10 MED ORDER — CARVEDILOL 25 MG PO TABS: ORAL_TABLET | ORAL | 3 refills | Status: DC

## 2022-01-10 MED ORDER — AMLODIPINE BESY-BENAZEPRIL HCL 10-40 MG PO CAPS: 1.0000 | ORAL_CAPSULE | Freq: Every day | ORAL | 3 refills | Status: DC

## 2022-01-10 NOTE — Progress Notes (Signed)
Cardiology Office Note:    Date:  01/10/2022   ID:  Christell Constant, DOB [birth year], MRN [id number]  PCP:  Erlinda Hong, MD   [facility] Providers Cardiologist:  Bryan Lemma, MD     Referring MD: Erlinda Hong, MD   No chief complaint on file. Re-establish  History of Present Illness:    Gerrick Ray is a 71 y.o. male with a hx of  CABG (below), HTN, patient of Dr. Tye Savoy in today for medication refills.   No CP , no SOB. Good diet. He exercises at the gym consistently. BP is well controlled  He works for [employer].  Former Hotel manager in [place]   Cardiology Studies: CAD s/p CABG x 4 (LIMA-LAD, SVG-OM, SVG-RI, SVG-RCA) 05/2004,  Last cardiac catheterization in 2007 showed patent LIMA to LAD, patent SVG to OM1, occluded SVG to ramus intermedius,  Myoview in August 2014 was read as LOW RISK,  -- 2-3 mm ST segment depression with stress. -no Cath b/s no Sx.  (PLAN: for future ST - do Lexiscan to avoid EKG concern)   Past Medical History:  Diagnosis Date   Abnormal Nuclear Stress Test -- Myoview 10/03/2012   Exercise 8:30 min; 10.1 METS, no chest pain; 1-2 mm ST segment depression suggesting ischemia; no scintigraphic evidence of ischemia or infarction. --> Negative/low risk stress test   CAD (coronary artery disease) of bypass graft    S/P  PCI of both SVG-OM and SVG-RI   CAD in native artery    Cath October 2007: RI-100% occluded, LAD-severe proximal 88 5%, mid 80% beyond SP1/D1 competitive flow.  Circumflex-OM1- 100 occluded.  RCA was occluded SVG-RI 100% occluded SVG-distal RCA, SVG-OM1 widely patent.  LIMA LAD patent.   Dyslipidemia, goal LDL below 70     formerly on Vytorin   Erectile dysfunction    He uses Viagra   H/O non-ST elevation myocardial infarction (NSTEMI) 06/2004   CATH - Multivessel CAD -->CABG X 4   Hypertension    Presence of bare metal stent in left circumflex coronary artery    PCI RI: 2.25mm x 12 mm Mini Vision BMS    Presence of bypass graft stent    PCI SVG-OM: 3.0 x 18 Cypher DES   S/P CABG x 4 06/2004   LIMA-LAD, SVG to OM, SVG-RI, SVG-RCA.    Past Surgical History:  Procedure Laterality Date   CORONARY ANGIOPLASTY WITH STENT PLACEMENT     PCI to the ostial SVG-OM - 3.0 mm x 18 mm Cypher DES   CORONARY ANGIOPLASTY WITH STENT PLACEMENT     Anastomotic PCI SVG-RI. -- 2.0 x 12 mm Mini Vision stent.   CORONARY ARTERY BYPASS GRAFT  May 2006    4 vessel:  LIMA-LAD, SVG-Cx-OM, SVG-RI , SVG-RCA    Current Medications: Current Meds  Medication Sig   amLODipine-benazepril (LOTREL) 10-40 MG capsule TAKE 1 CAPSULE DAILY   aspirin EC 81 MG tablet Take 81 mg by mouth daily.   atorvastatin (LIPITOR) 40 MG tablet TAKE 1 TABLET DAILY AT 6PM   carvedilol (COREG) 25 MG tablet TAKE 1 TABLET BY MOUTH ONCE DAILY DUE  FOR  FOLLOW  UP  (FEB)   chlorthalidone (HYGROTON) 25 MG tablet Take 1 tablet (25 mg total) by mouth daily. NEED OV.     Allergies:   Patient has no known allergies.   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   [place]. He currently works for [employer].   The mood due to political unrest.   He is a married, father of 7, grandfather of 3.   He exercises routinely at least 2 times a week on the treadmill for maybe an hour at a time.   Social Determinants of Health   Financial Resource Strain: Not on file  Food Insecurity: Not on file  Transportation Needs: Not on  file  Physical Activity: Not on file  Stress: Not on file  Social Connections: Not on file     Family History: NA  ROS:   Please see the history of present illness.     All other systems reviewed and are negative.  EKGs/Labs/Other Studies Reviewed:    The following studies were reviewed today:   EKG:  EKG is  ordered today.  The ekg ordered today demonstrates   01/10/2022- NSR, LVH with repol  Recent Labs: No results found for requested labs within last 365 days.  Recent Lipid Panel    Component Value Date/Time   CHOL 142 01/07/2017 1014   TRIG 44 01/07/2017 1014   HDL 68 01/07/2017 1014   CHOLHDL 2.1 01/07/2017 1014   CHOLHDL 2.2 01/09/2016 0857   VLDL 9 01/09/2016 0857   LDLCALC 65 01/07/2017 1014     Risk Assessment/Calculations:     Physical Exam:    VS:   Vitals:   01/10/22 1350  BP: 130/60  Pulse: (!) 59  SpO2: 97%     Ht 5\' 11"  (1.803 m)   Wt 212 lb 6.4 oz (96.3 kg)   BMI 29.62 kg/m     Wt Readings from Last 3 Encounters:  01/10/22 212 lb 6.4 oz (96.3 kg)  01/09/19 227 lb (103 kg)  11/25/17 210 lb (95.3 kg)     GEN:  Well nourished, well developed in no acute distress HEENT: Normal NECK: No JVD; No carotid bruits LYMPHATICS: No lymphadenopathy CARDIAC: RRR, no murmurs, rubs, gallops RESPIRATORY:  Clear to auscultation without rales, wheezing or rhonchi  ABDOMEN: Soft, non-tender, non-distended MUSCULOSKELETAL:  No edema; No deformity  SKIN: Warm and dry NEUROLOGIC:  Alert and oriented x 3 PSYCHIATRIC:  Normal affect   ASSESSMENT:    CABG: asymptomatic. Continue coreg 25 mg BID , asa 81 mg daily, lipitor 40 mg daily.   HTN: well controlled. Continue chlorthalidone 25 mg daily, amlodipine-benazepril 10-40 mg daily.  PLAN:    In order of problems listed above:  Refill meds Follow up 6 months           Medication Adjustments/Labs and Tests Ordered: Current medicines are reviewed at length with the patient today.  Concerns  regarding medicines are outlined above.  No orders of the defined types were placed in this encounter.  No orders of the defined types were placed in this encounter.   There are no Patient Instructions on file for this visit.  Signed, Maisie Fus, MD  01/10/2022 1:50 PM    [facility]

## 2022-01-10 NOTE — Patient Instructions (Addendum)
Medication Instructions:  Your physician recommends that you continue on your current medications as directed. Please refer to the Current Medication list given to you today.   *If you need a refill on your cardiac medications before your next appointment, please call your pharmacy*   Lab Work: NONE ordered at this time of appointment   If you have labs (blood work) drawn today and your tests are completely normal, you will receive your results only by: MyChart Message (if you have MyChart) OR A paper copy in the mail If you have any lab test that is abnormal or we need to change your treatment, we will call you to review the results.   Testing/Procedures: NONE ordered at this time of appointment     Follow-Up: At [facility], you and your health needs are our priority.  As part of our continuing mission to provide you with exceptional heart care, we have created designated Provider Care Teams.  These Care Teams include your primary Cardiologist (physician) and Advanced Practice Providers (APPs -  Physician Assistants and Nurse Practitioners) who all work together to provide you with the care you need, when you need it.  We recommend signing up for the patient portal called "MyChart".  Sign up information is provided on this After Visit Summary.  MyChart is used to connect with patients for Virtual Visits (Telemedicine).  Patients are able to view lab/test results, encounter notes, upcoming appointments, etc.  Non-urgent messages can be sent to your provider as well.   To learn more about what you can do with MyChart, go to [email or web address].    Your next appointment:   6 month(s)  The format for your next appointment:   In Person  Provider:   David Harding, MD     Other Instructions   Important Information About Sugar       

## 2022-07-09 ENCOUNTER — Ambulatory Visit: Payer: Self-pay | Attending: Cardiology | Admitting: Cardiology

## 2022-07-10 ENCOUNTER — Encounter: Payer: Self-pay | Admitting: Cardiology

## 2022-08-28 ENCOUNTER — Telehealth: Payer: Self-pay | Admitting: Adult Health

## 2022-08-28 NOTE — Telephone Encounter (Signed)
LVM x2 to r/s appt on 07/05 with Joni Reining, NP. Appt has been cancelled.

## 2022-08-31 ENCOUNTER — Ambulatory Visit: Payer: Self-pay | Admitting: Adult Health

## 2022-08-31 ENCOUNTER — Ambulatory Visit: Payer: No Typology Code available for payment source | Attending: Adult Health | Admitting: Nurse Practitioner

## 2022-08-31 ENCOUNTER — Encounter: Payer: Self-pay | Admitting: Nurse Practitioner

## 2022-08-31 VITALS — BP 124/80 | HR 64 | Ht 71.0 in | Wt 212.2 lb

## 2022-08-31 DIAGNOSIS — Z951 Presence of aortocoronary bypass graft: Secondary | ICD-10-CM | POA: Diagnosis not present

## 2022-08-31 DIAGNOSIS — I2510 Atherosclerotic heart disease of native coronary artery without angina pectoris: Secondary | ICD-10-CM | POA: Diagnosis not present

## 2022-08-31 DIAGNOSIS — I10 Essential (primary) hypertension: Secondary | ICD-10-CM | POA: Diagnosis not present

## 2022-08-31 DIAGNOSIS — E785 Hyperlipidemia, unspecified: Secondary | ICD-10-CM

## 2022-08-31 NOTE — Progress Notes (Signed)
Office Visit    Patient Name: Kevin Yu Date of Encounter: 08/31/2022  Primary Care Provider:  Erlinda Hong, MD Primary Cardiologist:  Bryan Lemma, MD  Chief Complaint    72 year old male with a history of CAD s/p CABG x 4 (LIMA-LAD, SVG-OM, SVG-RI, SVG-RCA) in 2006, (also with documented history of DES-SVG-OM, PCI/BMS-SVG-RI though patient denies any cardiac interventions other than CABG, records unavailable for review), hypertension, and hyperlipidemia who presents for follow-up related to CAD and hypertension.  Past Medical History    Past Medical History:  Diagnosis Date   Abnormal Nuclear Stress Test -- Myoview 10/03/2012   Exercise 8:30 min; 10.1 METS, no chest pain; 1-2 mm ST segment depression suggesting ischemia; no scintigraphic evidence of ischemia or infarction. --> Negative/low risk stress test   CAD (coronary artery disease) of bypass graft    S/P  PCI of both SVG-OM and SVG-RI   CAD in native artery    Cath October 2007: RI-100% occluded, LAD-severe proximal 88 5%, mid 80% beyond SP1/D1 competitive flow.  Circumflex-OM1- 100 occluded.  RCA was occluded SVG-RI 100% occluded SVG-distal RCA, SVG-OM1 widely patent.  LIMA LAD patent.   Dyslipidemia, goal LDL below 70     formerly on Vytorin   Erectile dysfunction    He uses Viagra   H/O non-ST elevation myocardial infarction (NSTEMI) 06/2004   CATH - Multivessel CAD -->CABG X 4   Hypertension    Presence of bare metal stent in left circumflex coronary artery    PCI RI: 2.54mm x 12 mm Mini Vision BMS   Presence of bypass graft stent    PCI SVG-OM: 3.0 x 18 Cypher DES   S/P CABG x 4 06/2004   LIMA-LAD, SVG to OM, SVG-RI, SVG-RCA.   Past Surgical History:  Procedure Laterality Date   CORONARY ANGIOPLASTY WITH STENT PLACEMENT     PCI to the ostial SVG-OM - 3.0 mm x 18 mm Cypher DES   CORONARY ANGIOPLASTY WITH STENT PLACEMENT     Anastomotic PCI SVG-RI. -- 2.0 x 12 mm Mini Vision stent.   CORONARY ARTERY BYPASS  GRAFT  May 2006    4 vessel:  LIMA-LAD, SVG-Cx-OM, SVG-RI , SVG-RCA    Allergies  No Known Allergies   Labs/Other Studies Reviewed    The following studies were reviewed today:  Cardiac Studies & Procedures   CARDIAC CATHETERIZATION  CARDIAC CATHETERIZATION 08/17/2014   STRESS TESTS  NM MYOCAR MULTI W/SPECT W 08/17/2014             Recent Labs: No results found for requested labs within last 365 days.  Recent Lipid Panel    Component Value Date/Time   CHOL 142 01/07/2017 1014   TRIG 44 01/07/2017 1014   HDL 68 01/07/2017 1014   CHOLHDL 2.1 01/07/2017 1014   CHOLHDL 2.2 01/09/2016 0857   VLDL 9 01/09/2016 0857   LDLCALC 65 01/07/2017 1014    History of Present Illness    72 year old male with the above past medical history including CAD s/p CABG x 4 (LIMA-LAD, SVG-OM, SVG-RI, SVG-RCA) in 2006, (also with document history of DES-SVG-OM, PCI/BMS-SVG-RI though patient denies any cardiac interventions other than CABG, records unavailable for reiview), hypertension, and hyperlipidemia.  He has a history of CAD s/p CABG x 4 in 2006.  Most recent cardiac catheterization 2017 showed patent LIMA-LAD, patent SVG-OM 1, occluded SVG-RI.  Myoview in 2014 was low risk.  He was last seen in the office on 01/10/2022 by Dr. Wyline Mood and was  doing well from a cardiac standpoint.  He denied symptoms concerning for angina.  BP was well-controlled.  He presents today for follow-up.  Since his last visit he has done well from a cardiac standpoint.  He denies symptoms concerning for angina.  He remains active and still works 2 jobs.  Overall, he reports feeling well.  Home Medications    Current Outpatient Medications  Medication Sig Dispense Refill   amLODipine-benazepril (LOTREL) 10-40 MG capsule Take 1 capsule by mouth daily. 90 capsule 3   aspirin EC 81 MG tablet Take 81 mg by mouth daily.     atorvastatin (LIPITOR) 40 MG tablet TAKE 1 TABLET DAILY AT 6PM 90 tablet 3   carvedilol (COREG)  25 MG tablet TAKE 1 TABLET BY MOUTH ONCE DAILY DUE  FOR  FOLLOW  UP  (FEB) 90 tablet 3   chlorthalidone (HYGROTON) 25 MG tablet Take 1 tablet (25 mg total) by mouth daily. NEED OV. 90 tablet 3   No current facility-administered medications for this visit.     Review of Systems    He denies chest pain, palpitations, dyspnea, pnd, orthopnea, n, v, dizziness, syncope, edema, weight gain, or early satiety. All other systems reviewed and are otherwise negative except as noted above.   Physical Exam    VS:  BP 124/80   Pulse 64   Ht 5\' 11"  (1.803 m)   Wt 212 lb 3.2 oz (96.3 kg)   SpO2 98%   BMI 29.60 kg/m  GEN: Well nourished, well developed, in no acute distress. HEENT: normal. Neck: Supple, no JVD, carotid bruits, or masses. Cardiac: RRR, no murmurs, rubs, or gallops. No clubbing, cyanosis, edema.  Radials/DP/PT 2+ and equal bilaterally.  Respiratory:  Respirations regular and unlabored, clear to auscultation bilaterally. GI: Soft, nontender, nondistended, BS + x 4. MS: no deformity or atrophy. Skin: warm and dry, no rash. Neuro:  Strength and sensation are intact. Psych: Normal affect.  Accessory Clinical Findings    ECG personally reviewed by me today - EKG Interpretation Date/Time:  Friday August 31 2022 09:15:33 EDT Ventricular Rate:  63 PR Interval:  226 QRS Duration:  88 QT Interval:  384 QTC Calculation: 392 R Axis:   38  Text Interpretation: Sinus rhythm with 1st degree A-V block ST & T wave abnormality, consider lateral ischemia When compared with ECG of 25-Oct-2004 03:45, PR interval has increased Nonspecific T wave abnormality now evident in Inferior leads Confirmed by Bernadene Person ([id number]) on 08/31/2022 9:40:14 AM  - no acute changes.   Lab Results  Component Value Date   WBC 6.1 02/22/2014   HGB 13.5 (A) 02/22/2014   HCT 41.8 (A) 02/22/2014   MCV 83.3 02/22/2014   Lab Results  Component Value Date   CREATININE 1.20 01/07/2017   BUN 21 01/07/2017   NA 143  01/07/2017   K 4.0 01/07/2017   CL 101 01/07/2017   CO2 28 01/07/2017   Lab Results  Component Value Date   ALT 34 01/07/2017   AST 30 01/07/2017   ALKPHOS 60 01/07/2017   BILITOT 1.1 01/07/2017   Lab Results  Component Value Date   CHOL 142 01/07/2017   HDL 68 01/07/2017   LDLCALC 65 01/07/2017   TRIG 44 01/07/2017   CHOLHDL 2.1 01/07/2017    No results found for: "HGBA1C"  Assessment & Plan    1. CAD: S/p CABG x 4 in 2006. Stable with no anginal symptoms. No indication for ischemic evaluation.  Continue aspirin, carvedilol,  amlodipine-benazepril, chlorthalidone, and Lipitor.   2. Hypertension: BP well controlled. Continue current antihypertensive regimen.   3. Hyperlipidemia: No recent LDL on file.  He has a physical scheduled with his PCP at the end of this month and will have labs drawn at that time.  Continue Lipitor.  4. Disposition: Follow-up in 1 year.      Joylene Grapes, NP 08/31/2022, 10:04 AM

## 2022-08-31 NOTE — Patient Instructions (Addendum)
Medication Instructions:  Your physician recommends that you continue on your current medications as directed. Please refer to the Current Medication list given to you today.  *If you need a refill on your cardiac medications before your next appointment, please call your pharmacy*   Lab Work: NONE ordered at this time of appointment   Testing/Procedures: NONE ordered at this time of appointment     Follow-Up: At [facility], you and your health needs are our priority.  As part of our continuing mission to provide you with exceptional heart care, we have created designated Provider Care Teams.  These Care Teams include your primary Cardiologist (physician) and Advanced Practice Providers (APPs -  Physician Assistants and Nurse Practitioners) who all work together to provide you with the care you need, when you need it.  We recommend signing up for the patient portal called "MyChart".  Sign up information is provided on this After Visit Summary.  MyChart is used to connect with patients for Virtual Visits (Telemedicine).  Patients are able to view lab/test results, encounter notes, upcoming appointments, etc.  Non-urgent messages can be sent to your provider as well.   To learn more about what you can do with MyChart, go to [email or web address].    Your next appointment:   1 year(s)  Provider:   David Harding, MD     Other Instructions   

## 2022-12-11 ENCOUNTER — Other Ambulatory Visit: Payer: Self-pay | Admitting: Internal Medicine

## 2023-01-09 ENCOUNTER — Telehealth: Payer: Self-pay | Admitting: Cardiology

## 2023-01-09 MED ORDER — CHLORTHALIDONE 25 MG PO TABS: 25.0000 mg | ORAL_TABLET | Freq: Every day | ORAL | 2 refills | Status: DC

## 2023-01-09 MED ORDER — ATORVASTATIN CALCIUM 40 MG PO TABS: ORAL_TABLET | ORAL | 2 refills | Status: AC

## 2023-01-09 MED ORDER — CARVEDILOL 25 MG PO TABS: ORAL_TABLET | ORAL | 2 refills | Status: AC

## 2023-01-09 NOTE — Telephone Encounter (Signed)
*  STAT* If patient is at the pharmacy, call can be transferred to refill team.   1. Which medications need to be refilled? (please list name of each medication and dose if known) carvedilol (COREG) 25 MG tablet   chlorthalidone (HYGROTON) 25 MG tablet  atorvastatin (LIPITOR) 40 MG tablet   2. Which pharmacy/location (including street and city if local pharmacy) is medication to be sent to?  [facility] [address], [place] - [address]      3. Do they need a 30 day or 90 day supply? 90 day   Pt is out of medication

## 2023-01-09 NOTE — Telephone Encounter (Signed)
RX sent to requested Pharmacy

## 2023-09-09 ENCOUNTER — Other Ambulatory Visit: Payer: Self-pay | Admitting: Cardiology

## 2023-10-02 ENCOUNTER — Other Ambulatory Visit: Payer: Self-pay | Admitting: Cardiology

## 2023-10-30 ENCOUNTER — Other Ambulatory Visit: Payer: Self-pay | Admitting: Cardiology

## 2023-11-04 ENCOUNTER — Other Ambulatory Visit: Payer: Self-pay

## 2023-11-04 MED ORDER — CHLORTHALIDONE 25 MG PO TABS: 25.0000 mg | ORAL_TABLET | Freq: Every day | ORAL | 0 refills | Status: DC

## 2023-12-10 ENCOUNTER — Other Ambulatory Visit: Payer: Self-pay | Admitting: Cardiology

## 2023-12-13 ENCOUNTER — Other Ambulatory Visit: Payer: Self-pay | Admitting: Cardiology

## 2023-12-29 ENCOUNTER — Other Ambulatory Visit: Payer: Self-pay | Admitting: Cardiology

## 2024-01-01 ENCOUNTER — Other Ambulatory Visit: Payer: Self-pay | Admitting: Cardiology
